# Patient Record
Sex: Female | Born: 1945 | Race: White | Hispanic: No | Marital: Married | Smoking: Former smoker
Health system: Southern US, Community
[De-identification: ages and names within clinical notes are randomized; demographics above are authoritative.]

## PROBLEM LIST (undated history)

## (undated) DIAGNOSIS — M069 Rheumatoid arthritis, unspecified: Secondary | ICD-10-CM

## (undated) DIAGNOSIS — E079 Disorder of thyroid, unspecified: Secondary | ICD-10-CM

## (undated) DIAGNOSIS — J45909 Unspecified asthma, uncomplicated: Secondary | ICD-10-CM

## (undated) DIAGNOSIS — I10 Essential (primary) hypertension: Secondary | ICD-10-CM

## (undated) DIAGNOSIS — M810 Age-related osteoporosis without current pathological fracture: Secondary | ICD-10-CM

## (undated) DIAGNOSIS — Z86010 Personal history of colonic polyps: Secondary | ICD-10-CM

## (undated) DIAGNOSIS — E785 Hyperlipidemia, unspecified: Secondary | ICD-10-CM

## (undated) DIAGNOSIS — H269 Unspecified cataract: Secondary | ICD-10-CM

## (undated) DIAGNOSIS — R011 Cardiac murmur, unspecified: Secondary | ICD-10-CM

## (undated) DIAGNOSIS — K5900 Constipation, unspecified: Secondary | ICD-10-CM

## (undated) HISTORY — DX: Hyperlipidemia, unspecified: E78.5

## (undated) HISTORY — PX: MOUTH SURGERY: SHX715

## (undated) HISTORY — DX: Rheumatoid arthritis, unspecified: M06.9

## (undated) HISTORY — DX: Constipation, unspecified: K59.00

## (undated) HISTORY — PX: POLYPECTOMY: SHX149

## (undated) HISTORY — DX: Cardiac murmur, unspecified: R01.1

## (undated) HISTORY — DX: Personal history of colonic polyps: Z86.010

## (undated) HISTORY — DX: Age-related osteoporosis without current pathological fracture: M81.0

## (undated) HISTORY — DX: Unspecified asthma, uncomplicated: J45.909

## (undated) HISTORY — PX: COLONOSCOPY: SHX174

## (undated) HISTORY — DX: Essential (primary) hypertension: I10

## (undated) HISTORY — DX: Disorder of thyroid, unspecified: E07.9

## (undated) HISTORY — DX: Unspecified cataract: H26.9

---

## 2004-09-12 ENCOUNTER — Ambulatory Visit: Payer: Self-pay | Admitting: Gastroenterology

## 2004-10-11 ENCOUNTER — Ambulatory Visit: Payer: Self-pay | Admitting: Gastroenterology

## 2012-10-03 ENCOUNTER — Encounter: Payer: Self-pay | Admitting: Gastroenterology

## 2014-08-06 DIAGNOSIS — D126 Benign neoplasm of colon, unspecified: Secondary | ICD-10-CM

## 2014-08-06 HISTORY — DX: Benign neoplasm of colon, unspecified: D12.6

## 2014-10-06 ENCOUNTER — Encounter: Payer: Self-pay | Admitting: Gastroenterology

## 2015-03-01 ENCOUNTER — Encounter: Payer: Self-pay | Admitting: Gastroenterology

## 2015-05-03 ENCOUNTER — Ambulatory Visit (AMBULATORY_SURGERY_CENTER): Payer: Self-pay

## 2015-05-03 VITALS — Ht <= 58 in | Wt 145.0 lb

## 2015-05-03 DIAGNOSIS — Z1211 Encounter for screening for malignant neoplasm of colon: Secondary | ICD-10-CM

## 2015-05-03 MED ORDER — NA SULFATE-K SULFATE-MG SULF 17.5-3.13-1.6 GM/177ML PO SOLN: ORAL | Status: DC

## 2015-05-03 NOTE — Progress Notes (Signed)
Per pt, no allergies to soy or egg products.Pt not taking any weight loss meds or using  O2 at home. 

## 2015-05-04 ENCOUNTER — Encounter: Payer: Self-pay | Admitting: Gastroenterology

## 2015-05-17 ENCOUNTER — Encounter: Payer: Self-pay | Admitting: Gastroenterology

## 2015-05-17 ENCOUNTER — Ambulatory Visit (AMBULATORY_SURGERY_CENTER): Payer: Medicare Other | Admitting: Gastroenterology

## 2015-05-17 VITALS — BP 116/78 | HR 69 | Temp 97.9°F | Resp 12 | Ht <= 58 in | Wt 145.0 lb

## 2015-05-17 DIAGNOSIS — K635 Polyp of colon: Secondary | ICD-10-CM | POA: Diagnosis not present

## 2015-05-17 DIAGNOSIS — D120 Benign neoplasm of cecum: Secondary | ICD-10-CM

## 2015-05-17 DIAGNOSIS — Z1211 Encounter for screening for malignant neoplasm of colon: Secondary | ICD-10-CM | POA: Diagnosis not present

## 2015-05-17 DIAGNOSIS — D124 Benign neoplasm of descending colon: Secondary | ICD-10-CM

## 2015-05-17 MED ORDER — SODIUM CHLORIDE 0.9 % IV SOLN: 500.0000 mL | INTRAVENOUS | Status: DC

## 2015-05-17 NOTE — Progress Notes (Signed)
Called to room to assist during endoscopic procedure.  Patient ID and intended procedure confirmed with present staff. Received instructions for my participation in the procedure from the performing physician.  

## 2015-05-17 NOTE — Progress Notes (Signed)
Report to PACU, RN, vss, BBS= Clear.  

## 2015-05-17 NOTE — Op Note (Signed)
[place]  [address] [place], [id number]   COLONOSCOPY PROCEDURE REPORT PATIENT: Haley Pham, Haley Pham  MR#: [id number] BIRTHDATE: 08/26/1945 , 38  yrs. old GENDER: female ENDOSCOPIST: Ladene Artist, MD, [facility] REFERRED JJ:OACZ Bea Graff, M.D. PROCEDURE DATE:  05/17/2015 PROCEDURE:   Colonoscopy, screening and Colonoscopy with snare polypectomy First Screening Colonoscopy - Avg.  risk and is 50 yrs.  old or older - No.  Prior Negative Screening - Now for repeat screening. 10 or more years since last screening  History of Adenoma - Now for follow-up colonoscopy & has been > or = to 3 yrs.  N/A  Polyps removed today? Yes ASA CLASS:   Class II INDICATIONS:Screening for colonic neoplasia and Colorectal Neoplasm Risk Assessment for this procedure is average risk. MEDICATIONS: Monitored anesthesia care, Propofol 300 mg IV, and lidocaine 130 mg IV DESCRIPTION OF PROCEDURE:   After the risks benefits and alternatives of the procedure were thoroughly explained, informed consent was obtained.  The digital rectal exam revealed no abnormalities of the rectum.   The LB [id number]-H190 [id number]  endoscope was introduced through the anus and advanced to the cecum, which was identified by both the appendix and ileocecal valve. No adverse events experienced.   Limited by a tortuous and redundant colon. The quality of the prep was good.  (Suprep was used)  The instrument was then slowly withdrawn as the colon was fully examined. Estimated blood loss is zero unless otherwise noted in this procedure report.  COLON FINDINGS: A sessile polyp measuring 12 mm in size was found at the cecum.  A polypectomy was performed using snare cautery.  The resection was complete, the polyp tissue was completely retrieved and sent to histology. A pedunculated polyp measuring 7 mm in size was found in the descending colon.  A polypectomy was performed with a cold snare.  The resection was complete, the polyp  tissue was completely retrieved and sent to histology. There was moderate diverticulosis noted in the sigmoid colon.   The examination was otherwise normal.  Retroflexed views revealed internal Grade I hemorrhoids. The time to cecum = 10.9 Withdrawal time = 14.3   The scope was withdrawn and the procedure completed. COMPLICATIONS: There were no immediate complications. ENDOSCOPIC IMPRESSION: 1.   Sessile polyp at the cecum; polypectomy performed using snare cautery 2.   Pedunculated polyp in the descending colon; polypectomy performed with a cold snare 3.   Moderate diverticulosis in the sigmoid colon 4.   Grade l internal hemorrhoids  RECOMMENDATIONS: 1.  Hold Aspirin and all other NSAIDS for 2 weeks. 2.  Await pathology results 3.  Repeat colonoscopy 3 years if larger polyp adenomatous; 5 years if only smaller polyp adenomatous; otherwise 10 years 4.  High fiber diet with liberal fluid intake.  eSigned:  Ladene Artist, MD, [facility] 05/17/2015 9:47 AM

## 2015-05-17 NOTE — Patient Instructions (Signed)
YOU HAD AN ENDOSCOPIC PROCEDURE TODAY AT [facility]:   Refer to the procedure report that was given to you for any specific questions about what was found during the examination.  If the procedure report does not answer your questions, please call your gastroenterologist to clarify.  If you requested that your care partner not be given the details of your procedure findings, then the procedure report has been included in a sealed envelope for you to review at your convenience later.  YOU SHOULD EXPECT: Some feelings of bloating in the abdomen. Passage of more gas than usual.  Walking can help get rid of the air that was put into your GI tract during the procedure and reduce the bloating. If you had a lower endoscopy (such as a colonoscopy or flexible sigmoidoscopy) you may notice spotting of blood in your stool or on the toilet paper. If you underwent a bowel prep for your procedure, you may not have a normal bowel movement for a few days.  Please Note:  You might notice some irritation and congestion in your nose or some drainage.  This is from the oxygen used during your procedure.  There is no need for concern and it should clear up in a day or so.  SYMPTOMS TO REPORT IMMEDIATELY:   Following lower endoscopy (colonoscopy or flexible sigmoidoscopy):  Excessive amounts of blood in the stool  Significant tenderness or worsening of abdominal pains  Swelling of the abdomen that is new, acute  Fever of 100F or higher  For urgent or emergent issues, a gastroenterologist can be reached at any hour by calling [phone].   DIET: Your first meal following the procedure should be a small meal and then it is ok to progress to your normal diet. Heavy or fried foods are harder to digest and may make you feel nauseous or bloated.  Likewise, meals heavy in dairy and vegetables can increase bloating.  Drink plenty of fluids but you should avoid alcoholic beverages for 24  hours.  ACTIVITY:  You should plan to take it easy for the rest of today and you should NOT DRIVE or use heavy machinery until tomorrow (because of the sedation medicines used during the test).    FOLLOW UP: Our staff will call the number listed on your records the next business day following your procedure to check on you and address any questions or concerns that you may have regarding the information given to you following your procedure. If we do not reach you, we will leave a message.  However, if you are feeling well and you are not experiencing any problems, there is no need to return our call.  We will assume that you have returned to your regular daily activities without incident.  If any biopsies were taken you will be contacted by phone or by letter within the next 1-3 weeks.  Please call us at [phone] if you have not heard about the biopsies in 3 weeks.    SIGNATURES/CONFIDENTIALITY: You and/or your care partner have signed paperwork which will be entered into your electronic medical record.  These signatures attest to the fact that that the information above on your After Visit Summary has been reviewed and is understood.  Full responsibility of the confidentiality of this discharge information lies with you and/or your care-partner.  Hold Aspirin and all other NSAIDS for 2 weeks Await pathology results

## 2015-05-18 ENCOUNTER — Telehealth: Payer: Self-pay

## 2015-05-18 NOTE — Telephone Encounter (Signed)
  Follow up Call-  Call back number 05/17/2015  Post procedure Call Back phone  # [phone]  Permission to leave phone message Yes     Patient questions:  Do you have a fever, pain , or abdominal swelling? No. Pain Score  0 *  Have you tolerated food without any problems? Yes.    Have you been able to return to your normal activities? Yes.    Do you have any questions about your discharge instructions: Diet   No. Medications  No. Follow up visit  No.  Do you have questions or concerns about your Care? No.  Actions: * If pain score is 4 or above: No action needed, pain <4.  No problems per the pt.  She had questions about the photos on the procedure report that I answered. maw

## 2015-05-23 ENCOUNTER — Encounter: Payer: Self-pay | Admitting: Gastroenterology

## 2015-05-27 ENCOUNTER — Telehealth: Payer: Self-pay | Admitting: Gastroenterology

## 2015-05-27 NOTE — Telephone Encounter (Signed)
All questions answered about results of pathology.  She will call back for any additional questions or concerns

## 2015-05-27 NOTE — Telephone Encounter (Signed)
Left message for patient to call back  

## 2015-08-07 HISTORY — PX: CATARACT EXTRACTION: SUR2

## 2015-10-03 DIAGNOSIS — E039 Hypothyroidism, unspecified: Secondary | ICD-10-CM

## 2015-10-03 DIAGNOSIS — J4520 Mild intermittent asthma, uncomplicated: Secondary | ICD-10-CM

## 2015-10-03 DIAGNOSIS — F419 Anxiety disorder, unspecified: Secondary | ICD-10-CM | POA: Insufficient documentation

## 2015-10-03 DIAGNOSIS — M1990 Unspecified osteoarthritis, unspecified site: Secondary | ICD-10-CM

## 2015-10-03 DIAGNOSIS — Z79899 Other long term (current) drug therapy: Secondary | ICD-10-CM | POA: Insufficient documentation

## 2015-10-03 DIAGNOSIS — E782 Mixed hyperlipidemia: Secondary | ICD-10-CM | POA: Insufficient documentation

## 2015-10-03 DIAGNOSIS — M069 Rheumatoid arthritis, unspecified: Secondary | ICD-10-CM | POA: Insufficient documentation

## 2015-10-03 DIAGNOSIS — E559 Vitamin D deficiency, unspecified: Secondary | ICD-10-CM | POA: Insufficient documentation

## 2015-10-03 DIAGNOSIS — I10 Essential (primary) hypertension: Secondary | ICD-10-CM | POA: Insufficient documentation

## 2015-10-03 HISTORY — DX: Anxiety disorder, unspecified: F41.9

## 2015-10-03 HISTORY — DX: Essential (primary) hypertension: I10

## 2015-10-03 HISTORY — DX: Other long term (current) drug therapy: Z79.899

## 2015-10-03 HISTORY — DX: Unspecified osteoarthritis, unspecified site: M19.90

## 2015-10-03 HISTORY — DX: Mixed hyperlipidemia: E78.2

## 2015-10-03 HISTORY — DX: Vitamin D deficiency, unspecified: E55.9

## 2015-10-03 HISTORY — DX: Hypothyroidism, unspecified: E03.9

## 2015-10-03 HISTORY — DX: Mild intermittent asthma, uncomplicated: J45.20

## 2017-03-27 DIAGNOSIS — H833X1 Noise effects on right inner ear: Secondary | ICD-10-CM

## 2017-03-27 HISTORY — DX: Noise effects on right inner ear: H83.3X1

## 2017-09-12 DIAGNOSIS — M8589 Other specified disorders of bone density and structure, multiple sites: Secondary | ICD-10-CM

## 2017-09-12 DIAGNOSIS — H3530 Unspecified macular degeneration: Secondary | ICD-10-CM

## 2017-09-12 DIAGNOSIS — R7303 Prediabetes: Secondary | ICD-10-CM

## 2017-09-12 HISTORY — DX: Other specified disorders of bone density and structure, multiple sites: M85.89

## 2017-09-12 HISTORY — DX: Prediabetes: R73.03

## 2017-09-12 HISTORY — DX: Unspecified macular degeneration: H35.30

## 2018-03-12 DIAGNOSIS — F5101 Primary insomnia: Secondary | ICD-10-CM

## 2018-03-12 HISTORY — DX: Primary insomnia: F51.01

## 2018-06-02 ENCOUNTER — Encounter: Payer: Self-pay | Admitting: Gastroenterology

## 2018-06-04 ENCOUNTER — Encounter: Payer: Self-pay | Admitting: Gastroenterology

## 2018-07-08 ENCOUNTER — Other Ambulatory Visit: Payer: Self-pay

## 2018-07-08 ENCOUNTER — Ambulatory Visit (AMBULATORY_SURGERY_CENTER): Payer: Self-pay

## 2018-07-08 VITALS — Ht <= 58 in | Wt 153.4 lb

## 2018-07-08 DIAGNOSIS — Z86010 Personal history of colonic polyps: Secondary | ICD-10-CM

## 2018-07-08 MED ORDER — PEG 3350-KCL-NA BICARB-NACL 420 G PO SOLR: 4000.0000 mL | Freq: Once | ORAL | 0 refills | Status: AC

## 2018-07-08 NOTE — Progress Notes (Signed)
No egg or soy allergy known to patient  No issues with past sedation with any surgeries  or procedures, no intubation problems  No diet pills per patient No home 02 use per patient  No blood thinners per patient  Pt denies issues with constipation  No A fib or A flutter  EMMI video sent to pt's e mail  

## 2018-07-23 ENCOUNTER — Telehealth: Payer: Self-pay | Admitting: Gastroenterology

## 2018-07-23 DIAGNOSIS — R011 Cardiac murmur, unspecified: Secondary | ICD-10-CM | POA: Insufficient documentation

## 2018-07-23 NOTE — Telephone Encounter (Signed)
Patient will call back after her appt with primary to cancel if they advise.  Otherwise she will keep appt in the am

## 2018-07-23 NOTE — Telephone Encounter (Signed)
Pt's husband called back and said her PCP is recommending she cancel her colon tomorrow.  She will call back to reschedule

## 2018-07-23 NOTE — Telephone Encounter (Signed)
Pt called in today stating that she was not feeling well and think it may be related to bp however she has appt with pcp at 3pm and want to know if she should still start her prep?? She is unsure of what is going on with her and dont know if the dx will cause her to have to resched. She would like to speak with nurse

## 2018-07-24 ENCOUNTER — Encounter: Payer: Medicare Other | Admitting: Gastroenterology

## 2018-07-24 NOTE — Telephone Encounter (Signed)
Noted  

## 2018-08-27 ENCOUNTER — Other Ambulatory Visit (HOSPITAL_COMMUNITY): Payer: Self-pay | Admitting: Internal Medicine

## 2018-08-27 ENCOUNTER — Other Ambulatory Visit (INDEPENDENT_AMBULATORY_CARE_PROVIDER_SITE_OTHER): Payer: Medicare Other

## 2018-08-27 ENCOUNTER — Other Ambulatory Visit: Payer: Self-pay

## 2018-08-27 DIAGNOSIS — R011 Cardiac murmur, unspecified: Secondary | ICD-10-CM | POA: Diagnosis not present

## 2018-08-27 NOTE — Progress Notes (Signed)
Complete echocardiogram has been performed.  Jimmy Jewelene Mairena RDCS, RVT 

## 2018-09-09 ENCOUNTER — Encounter: Payer: Self-pay | Admitting: Gastroenterology

## 2018-10-07 ENCOUNTER — Other Ambulatory Visit: Payer: Self-pay

## 2018-10-07 ENCOUNTER — Ambulatory Visit (AMBULATORY_SURGERY_CENTER): Payer: Self-pay | Admitting: *Deleted

## 2018-10-07 VITALS — Ht <= 58 in | Wt 155.0 lb

## 2018-10-07 DIAGNOSIS — Z86010 Personal history of colonic polyps: Secondary | ICD-10-CM

## 2018-10-07 NOTE — Progress Notes (Signed)
No egg or soy allergy known to patient  No issues with past sedation with any surgeries  or procedures, no intubation problems  No diet pills per patient No home 02 use per patient  No blood thinners per patient  Pt denies issues with constipation  No A fib or A flutter  EMMI video sent to pt's e mail - declined  Pt has a golytely prep at home from 07-2018 PV

## 2018-10-20 ENCOUNTER — Telehealth: Payer: Self-pay | Admitting: *Deleted

## 2018-10-20 NOTE — Telephone Encounter (Signed)
Covid-19 travel screening questions  Have you traveled in the last 14 days? no If yes where?  Do you now or have you had a fever in the last 14 days? no  Do you have any respiratory symptoms of shortness of breath or cough now or in the last 14 days? no  Do you have any family members or close contacts with diagnosed or suspected Covid-19? no       

## 2018-10-21 ENCOUNTER — Encounter: Payer: Self-pay | Admitting: Gastroenterology

## 2018-10-21 ENCOUNTER — Other Ambulatory Visit: Payer: Self-pay

## 2018-10-21 ENCOUNTER — Ambulatory Visit (AMBULATORY_SURGERY_CENTER): Payer: Medicare Other | Admitting: Gastroenterology

## 2018-10-21 VITALS — BP 107/74 | HR 76 | Temp 97.3°F | Resp 18 | Ht <= 58 in | Wt 155.0 lb

## 2018-10-21 DIAGNOSIS — Z86010 Personal history of colonic polyps: Secondary | ICD-10-CM

## 2018-10-21 MED ORDER — SODIUM CHLORIDE 0.9 % IV SOLN: 500.0000 mL | Freq: Once | INTRAVENOUS | Status: DC

## 2018-10-21 NOTE — Patient Instructions (Signed)
YOU HAD AN ENDOSCOPIC PROCEDURE TODAY AT [facility]:   Refer to the procedure report that was given to you for any specific questions about what was found during the examination.  If the procedure report does not answer your questions, please call your gastroenterologist to clarify.  If you requested that your care partner not be given the details of your procedure findings, then the procedure report has been included in a sealed envelope for you to review at your convenience later.  YOU SHOULD EXPECT: Some feelings of bloating in the abdomen. Passage of more gas than usual.  Walking can help get rid of the air that was put into your GI tract during the procedure and reduce the bloating. If you had a lower endoscopy (such as a colonoscopy or flexible sigmoidoscopy) you may notice spotting of blood in your stool or on the toilet paper. If you underwent a bowel prep for your procedure, you may not have a normal bowel movement for a few days.  Please Note:  You might notice some irritation and congestion in your nose or some drainage.  This is from the oxygen used during your procedure.  There is no need for concern and it should clear up in a day or so.  Read all handouts given to you by your recovery room nurse.  SYMPTOMS TO REPORT IMMEDIATELY:   Following lower endoscopy (colonoscopy or flexible sigmoidoscopy):  Excessive amounts of blood in the stool  Significant tenderness or worsening of abdominal pains  Swelling of the abdomen that is new, acute  Fever of 100F or higher   For urgent or emergent issues, a gastroenterologist can be reached at any hour by calling [phone].   DIET:  We do recommend a small meal at first, but then you may proceed to your regular diet.  Drink plenty of fluids but you should avoid alcoholic beverages for 24 hours.  ACTIVITY:  You should plan to take it easy for the rest of today and you should NOT DRIVE or use heavy machinery until tomorrow  (because of the sedation medicines used during the test).    FOLLOW UP: Our staff will call the number listed on your records the next business day following your procedure to check on you and address any questions or concerns that you may have regarding the information given to you following your procedure. If we do not reach you, we will leave a message.  However, if you are feeling well and you are not experiencing any problems, there is no need to return our call.  We will assume that you have returned to your regular daily activities without incident.  If any biopsies were taken you will be contacted by phone or by letter within the next 1-3 weeks.  Please call us at [phone] if you have not heard about the biopsies in 3 weeks.    SIGNATURES/CONFIDENTIALITY: You and/or your care partner have signed paperwork which will be entered into your electronic medical record.  These signatures attest to the fact that that the information above on your After Visit Summary has been reviewed and is understood.  Full responsibility of the confidentiality of this discharge information lies with you and/or your care-partner.

## 2018-10-21 NOTE — Progress Notes (Signed)
PT taken to PACU. Monitors in place. VSS. Report given to RN. 

## 2018-10-21 NOTE — Op Note (Signed)
[facility] Patient Name: Haley Pham Procedure Date: 10/21/2018 9:31 AM MRN: [id number] Endoscopist: Ladene Artist , MD Age: 73 Referring MD:  Date of Birth: Mar 26, 1946 Gender: Female Account #: [id number] Procedure:                Colonoscopy Indications:              Surveillance: Personal history of adenomatous                            polyps on last colonoscopy > 3 years ago, High risk                            colon cancer surveillance: Personal history of                            adenoma with villous component Medicines:                Monitored Anesthesia Care Procedure:                Pre-Anesthesia Assessment:                           - Prior to the procedure, a History and Physical                            was performed, and patient medications and                            allergies were reviewed. The patient's tolerance of                            previous anesthesia was also reviewed. The risks                            and benefits of the procedure and the sedation                            options and risks were discussed with the patient.                            All questions were answered, and informed consent                            was obtained. Prior Anticoagulants: The patient has                            taken no previous anticoagulant or antiplatelet                            agents. ASA Grade Assessment: III - A patient with                            severe systemic disease. After reviewing the risks  and benefits, the patient was deemed in                            satisfactory condition to undergo the procedure.                           After obtaining informed consent, the colonoscope                            was passed under direct vision. Throughout the                            procedure, the patient's blood pressure, pulse, and                            oxygen saturations were monitored  continuously. The                            Colonoscope was introduced through the anus and                            advanced to the the cecum, identified by                            appendiceal orifice and ileocecal valve. The                            ileocecal valve, appendiceal orifice, and rectum                            were photographed. The quality of the bowel                            preparation was good. The colonoscopy was performed                            without difficulty. The patient tolerated the                            procedure well. Scope In: 9:36:36 AM Scope Out: 9:57:59 AM Scope Withdrawal Time: 0 hours 14 minutes 45 seconds  Total Procedure Duration: 0 hours 21 minutes 23 seconds  Findings:                 The perianal and digital rectal examinations were                            normal. Pertinent negatives include normal                            sphincter tone.                           Multiple medium-mouthed diverticula were found in  the left colon. There was no evidence of                            diverticular bleeding.                           Internal hemorrhoids were found during                            retroflexion. The hemorrhoids were small and Grade                            I (internal hemorrhoids that do not prolapse).                           The exam was otherwise without abnormality on                            direct and retroflexion views. Complications:            No immediate complications. Estimated blood loss:                            None. Estimated Blood Loss:     Estimated blood loss: none. Impression:               - Moderate diverticulosis in the left colon. There                            was no evidence of diverticular bleeding.                           - Internal hemorrhoids.                           - The examination was otherwise normal on direct                             and retroflexion views.                           - No specimens collected. Recommendation:           - Repeat colonoscopy in 5 years for surveillance.                           - Patient has a contact number available for                            emergencies. The signs and symptoms of potential                            delayed complications were discussed with the                            patient. Return to normal activities tomorrow.  Written discharge instructions were provided to the                            patient.                           - High fiber diet.                           - Continue present medications. Ladene Artist, MD 10/21/2018 10:07:12 AM This report has been signed electronically.

## 2018-10-22 ENCOUNTER — Telehealth: Payer: Self-pay

## 2018-10-22 NOTE — Telephone Encounter (Signed)
  Follow up Call-  Call back number 10/21/2018  Post procedure Call Back phone  # [phone]  Permission to leave phone message Yes  Some recent data might be hidden     Patient questions:  Do you have a fever, pain , or abdominal swelling? No. Pain Score  0 *  Have you tolerated food without any problems? Yes.    Have you been able to return to your normal activities? Yes.    Do you have any questions about your discharge instructions: Diet   No. Medications  No. Follow up visit  No.  Do you have questions or concerns about your Care? No.  Actions: * If pain score is 4 or above: No action needed, pain <4.

## 2019-01-08 ENCOUNTER — Telehealth: Payer: Self-pay | Admitting: Gastroenterology

## 2019-01-08 NOTE — Telephone Encounter (Signed)
Patient advised that this would be better evaluated as an in office visit.  She is placed on the cancellation list.

## 2019-01-08 NOTE — Telephone Encounter (Signed)
Pt reported fecal incontinence.  Please call her to advise.  Scheduled for virtual with Dr. Fuller Plan 6/24.

## 2019-01-27 ENCOUNTER — Telehealth: Payer: Self-pay

## 2019-01-27 NOTE — Telephone Encounter (Signed)
Covid-19 screening questions   Do you now or have you had a fever in the last 14 days? No  Do you have any respiratory symptoms of shortness of breath or cough now or in the last 14 days? No  Do you have any family members or close contacts with diagnosed or suspected Covid-19 in the past 14 days? No  Have you been tested for Covid-19 and found to be positive? No        

## 2019-01-27 NOTE — Telephone Encounter (Signed)
Error

## 2019-01-28 ENCOUNTER — Encounter: Payer: Self-pay | Admitting: Gastroenterology

## 2019-01-28 ENCOUNTER — Ambulatory Visit (INDEPENDENT_AMBULATORY_CARE_PROVIDER_SITE_OTHER): Payer: Medicare Other | Admitting: Gastroenterology

## 2019-01-28 VITALS — BP 124/70 | HR 62 | Ht <= 58 in | Wt 149.0 lb

## 2019-01-28 DIAGNOSIS — R152 Fecal urgency: Secondary | ICD-10-CM

## 2019-01-28 DIAGNOSIS — Z86010 Personal history of colonic polyps: Secondary | ICD-10-CM

## 2019-01-28 DIAGNOSIS — R159 Full incontinence of feces: Secondary | ICD-10-CM

## 2019-01-28 NOTE — Patient Instructions (Signed)
Take over the counter Colace three times a day. If this does not help your symptoms then start Miralax 1/2 scoop-2 scoops daily.   The goal is to have one complete bowel movement daily.   Thank you for choosing me and [facility].  Pricilla Riffle. Dagoberto Ligas., MD., [facility]

## 2019-01-28 NOTE — Progress Notes (Signed)
    History of Present Illness: This is a 73 year old female who relates constipation alternating with normal bowel movements.  She states when she has not had a bowel movement for 2 to 3 days she will often have urgency and occasionally incontinence.  She notes a large volume of soft stool.  She tried Colace which helped improve the frequency of her bowel movements but was not sufficient.  Anal sphincter tone prior to recent colonoscopy seemed normal.  Colonoscopy 10/2018 Left sided diverticulosis, small internal hemorrhoids.   Current Medications, Allergies, Past Medical History, Past Surgical History, Family History and Social History were reviewed in [employer] record.  Physical Exam: General: Well developed, well nourished, no acute distress Head: Normocephalic and atraumatic Eyes:  sclerae anicteric, EOMI Ears: Normal auditory acuity Mouth: No deformity or lesions Lungs: Clear throughout to auscultation Heart: Regular rate and rhythm; no murmurs, rubs or bruits Abdomen: Soft, non tender and non distended. No masses, hepatosplenomegaly or hernias noted. Normal Bowel sounds Rectal: Not done Musculoskeletal: Symmetrical with no gross deformities  Pulses:  Normal pulses noted Extremities: No clubbing, cyanosis, edema or deformities noted Neurological: Alert oriented x 4, grossly nonfocal Psychological:  Alert and cooperative. Normal mood and affect   Assessment and Recommendations:   1.  Constipation alternating with normal bowel movements.  Fecal urgency with incontinence generally associated with constipation constipation.  Advised to increase Colace to 2 or 3/day.  Increase daily fiber and water intake.  If Colace is not effective then discontinue and begin MiraLAX one half scoop to 2 scoops day daily titrated for a complete bowel movement daily.  Contact us in 1 month if symptoms not under adequate control.  2.  Personal history of tubulovillous adenoma in  2016.  Next surveillance colonoscopy recommended in March 2025.

## 2019-04-21 ENCOUNTER — Other Ambulatory Visit: Payer: Self-pay | Admitting: Physician Assistant

## 2019-10-19 DIAGNOSIS — D849 Immunodeficiency, unspecified: Secondary | ICD-10-CM | POA: Insufficient documentation

## 2019-10-19 HISTORY — DX: Immunodeficiency, unspecified: D84.9

## 2020-05-16 DIAGNOSIS — E538 Deficiency of other specified B group vitamins: Secondary | ICD-10-CM

## 2020-05-16 DIAGNOSIS — E611 Iron deficiency: Secondary | ICD-10-CM | POA: Insufficient documentation

## 2020-05-16 HISTORY — DX: Iron deficiency: E61.1

## 2020-05-16 HISTORY — DX: Deficiency of other specified B group vitamins: E53.8

## 2020-12-26 DIAGNOSIS — K5730 Diverticulosis of large intestine without perforation or abscess without bleeding: Secondary | ICD-10-CM

## 2020-12-26 HISTORY — DX: Diverticulosis of large intestine without perforation or abscess without bleeding: K57.30

## 2020-12-28 ENCOUNTER — Other Ambulatory Visit: Payer: Self-pay | Admitting: Specialist

## 2020-12-28 DIAGNOSIS — N83209 Unspecified ovarian cyst, unspecified side: Secondary | ICD-10-CM

## 2020-12-28 DIAGNOSIS — K8689 Other specified diseases of pancreas: Secondary | ICD-10-CM

## 2021-01-03 ENCOUNTER — Other Ambulatory Visit: Payer: Self-pay | Admitting: Specialist

## 2021-01-03 DIAGNOSIS — N838 Other noninflammatory disorders of ovary, fallopian tube and broad ligament: Secondary | ICD-10-CM

## 2021-01-16 ENCOUNTER — Ambulatory Visit
Admission: RE | Admit: 2021-01-16 | Discharge: 2021-01-16 | Disposition: A | Discharge status: Home / Self Care | Payer: Medicare Other | Source: Ambulatory Visit | Attending: Specialist | Admitting: Specialist

## 2021-01-16 ENCOUNTER — Other Ambulatory Visit: Payer: Self-pay

## 2021-01-16 DIAGNOSIS — K8689 Other specified diseases of pancreas: Secondary | ICD-10-CM

## 2021-01-16 DIAGNOSIS — N83209 Unspecified ovarian cyst, unspecified side: Secondary | ICD-10-CM

## 2021-01-16 IMAGING — MR MR ABDOMEN WO/W CM
16 of 20 series · 37 of 48 positions shown · IV contrast (12 mL Multihance)
Comparison: Abdominopelvic CT [DATE].

CLINICAL DATA: Low-density pancreatic lesions on abdominal CT.
Left-sided abdominal pain. No previous relevant surgery.

EXAM:
MRI ABDOMEN WITHOUT AND WITH CONTRAST
TECHNIQUE: Multiplanar multisequence MR imaging of the abdomen was performed
both before and after the administration of intravenous contrast.
CONTRAST:  12mL MULTIHANCE GADOBENATE DIMEGLUMINE 529 MG/ML IV SOLN

[Series 3: T2 · coronal · 5.0mm · 1.56mm/px · 1 of 33 slices shown (1 of 4)]
[im 1/33]
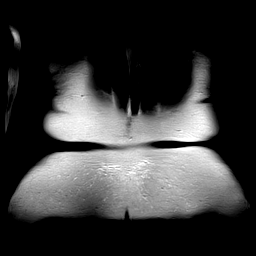

[Series 4: T1 · axial · 3.0mm · 1.19mm/px · z∈[+14,+203]mm · 4 of 128 slices shown]
[im 1/128]
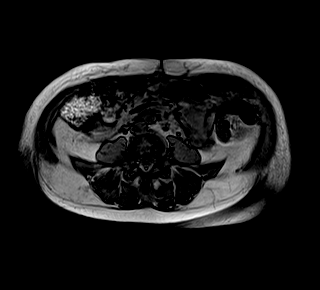
[im 43/128]
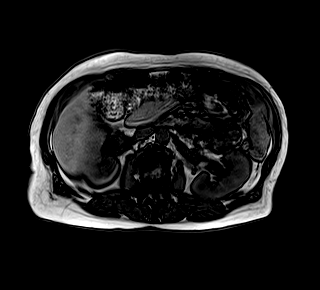
[im 85/128]
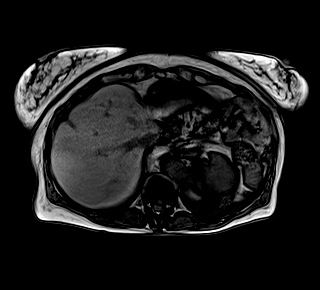
[im 128/128]
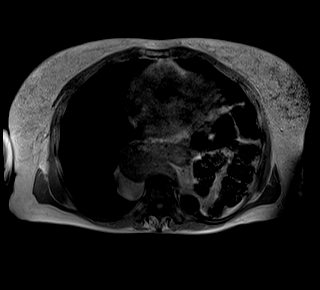

[Series 7: MRCP · coronal · 1.0mm · 0.49mm/px · 3 of 80 slices shown]
[im 1/80]
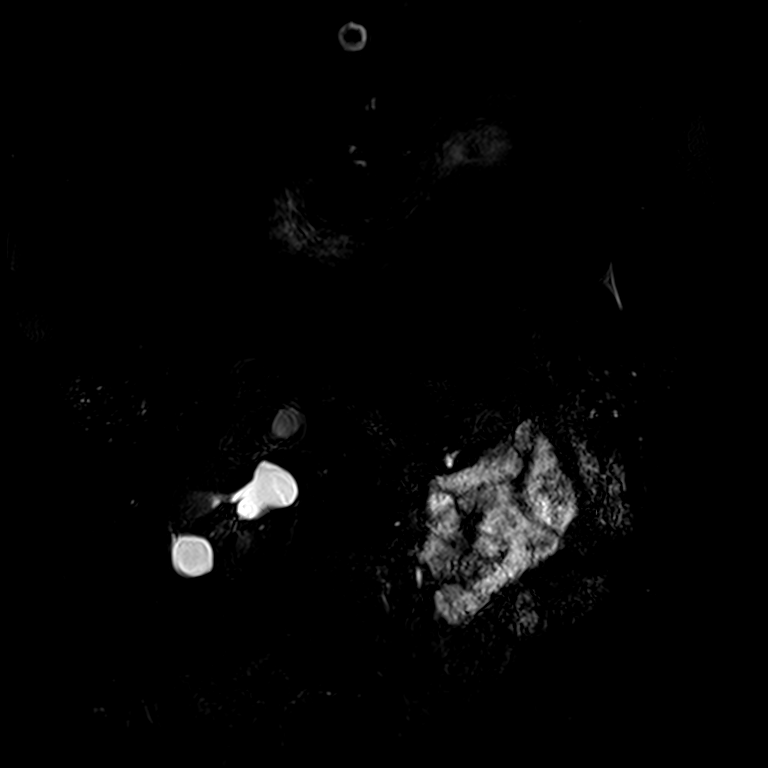
[im 40/80]
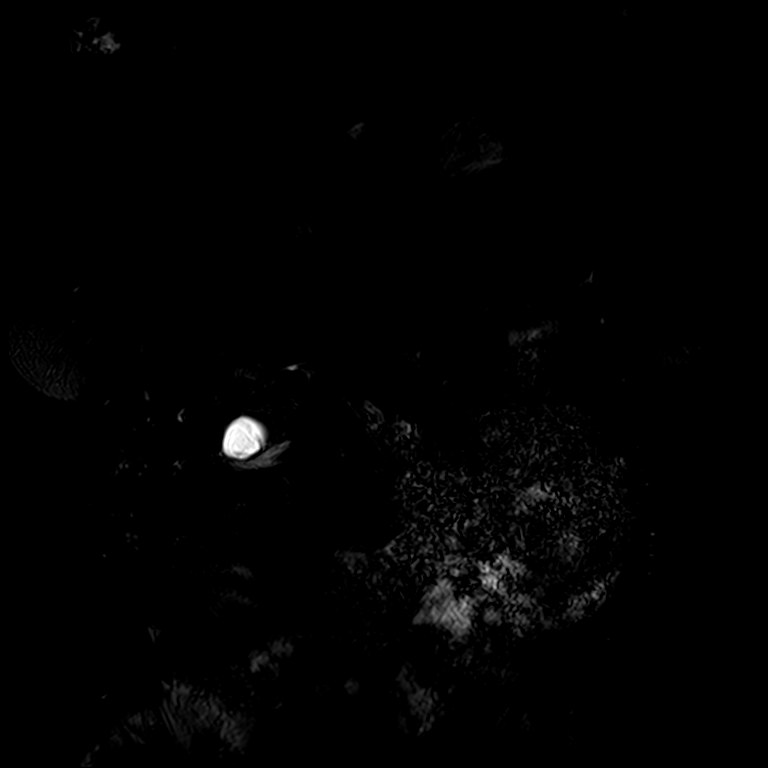
[im 80/80]
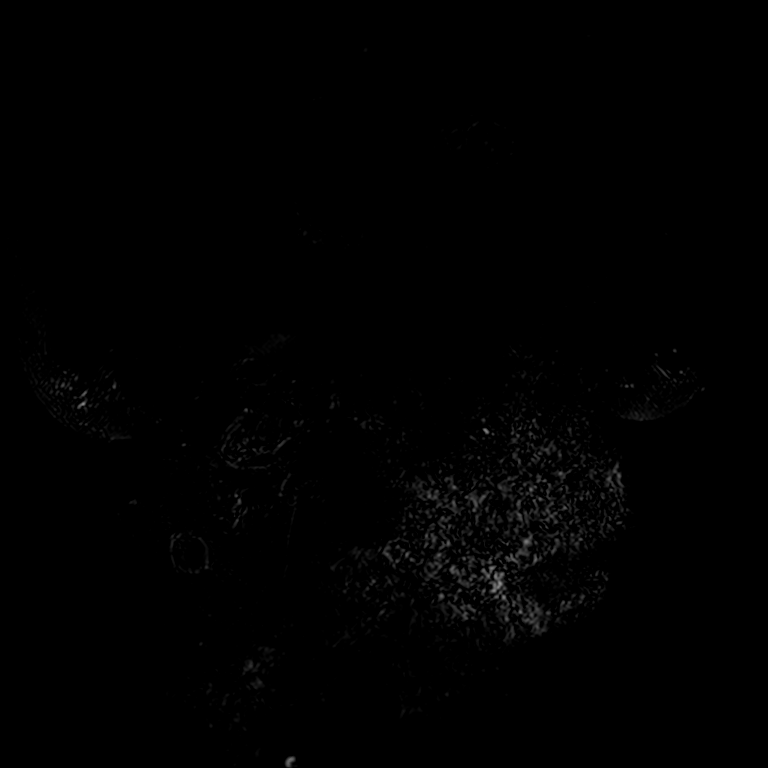

[Series 9: T2 · axial · 6.0mm · 1.22mm/px · 1 of 30 slices shown (2 of 4)]
[im 1/30]
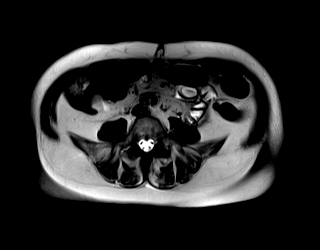

[Series 11: bSSFP · axial · 5.0mm · 1.25mm/px · 1 of 38 slices shown]
[im 1/38]
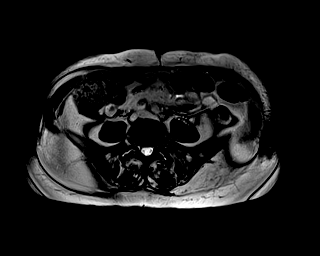

[Series 12: T2 · axial · 5.0mm · 1.48mm/px · 1 of 38 slices shown (3 of 4)]
[im 1/38]
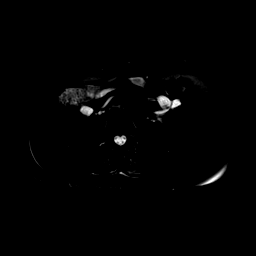

[Series 13: DWI · axial · 5.0mm · 1.42mm/px · z∈[+12,+222]mm · 4 of 108 slices shown (1 of 2)]
[im 1/108]
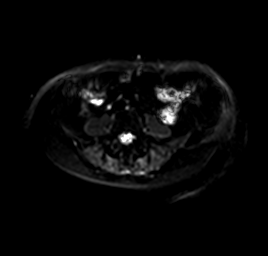
[im 36/108]
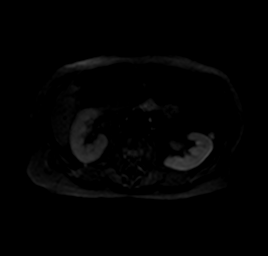
[im 72/108]
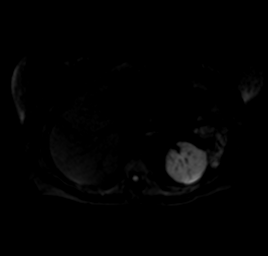
[im 108/108]
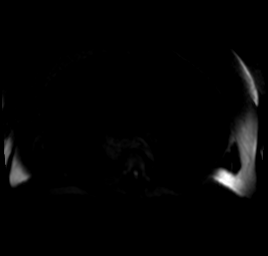

[Series 14: DWI · axial · 5.0mm · 1.42mm/px · 1 of 36 slices shown (2 of 2)]
[im 1/36]
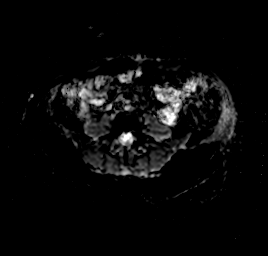

[Series 15: T2 · coronal · 3.0mm · 1.19mm/px · 1 of 25 slices shown (4 of 4)]
[im 1/25]
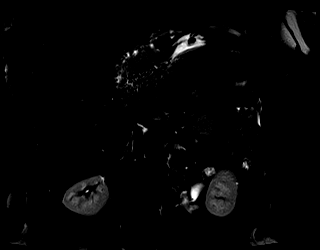

[Series 16: T1 dynamic · axial · non-contrast · 3.0mm · 1.25mm/px · z∈[+2,+215]mm · 3 of 72 slices shown]
[im 1/72]
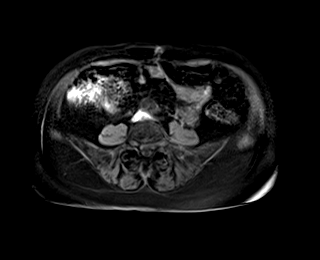
[im 36/72]
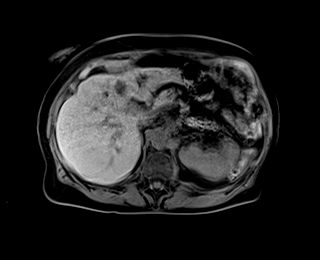
[im 72/72]
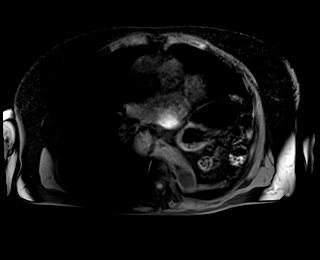

[Series 17: T1 dynamic post-contrast · axial · 3.0mm · 1.25mm/px · z∈[+2,+215]mm · 3 of 72 slices shown (1 of 6)]
[im 1/72]
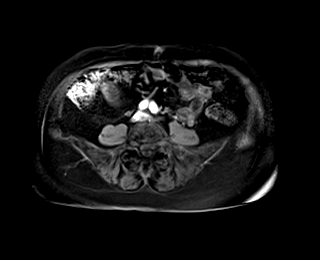
[im 36/72]
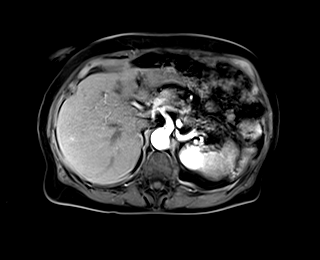
[im 72/72]
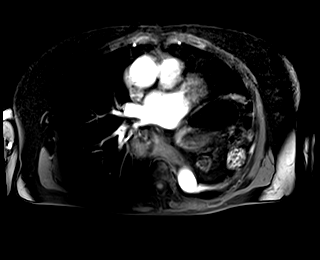

[Series 18: T1 dynamic post-contrast · axial · 3.0mm · 1.25mm/px · z∈[+2,+215]mm · 3 of 72 slices shown (2 of 6)]
[im 1/72]
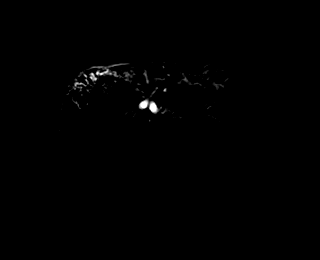
[im 36/72]
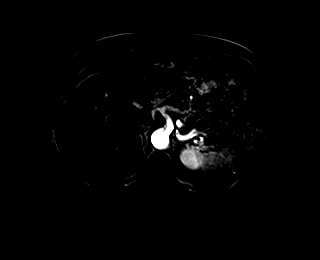
[im 72/72]
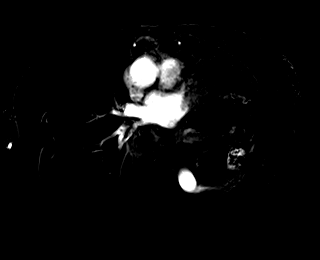

[Series 19: T1 dynamic post-contrast · axial · 3.0mm · 1.25mm/px · z∈[+2,+215]mm · 3 of 72 slices shown (3 of 6)]
[im 1/72]
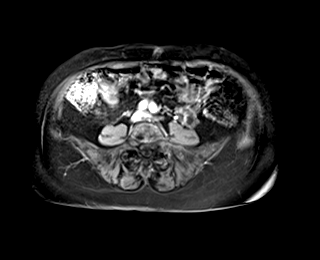
[im 36/72]
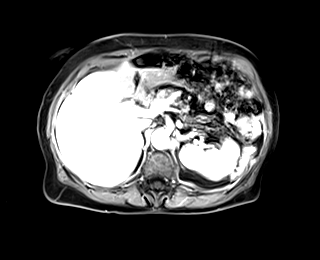
[im 72/72]
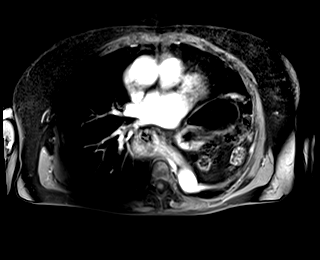

[Series 20: T1 dynamic post-contrast · axial · 3.0mm · 1.25mm/px · z∈[+2,+215]mm · 3 of 72 slices shown (4 of 6)]
[im 1/72]
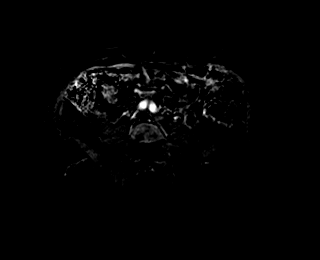
[im 36/72]
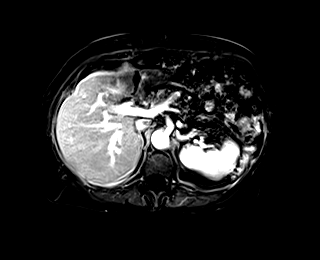
[im 72/72]
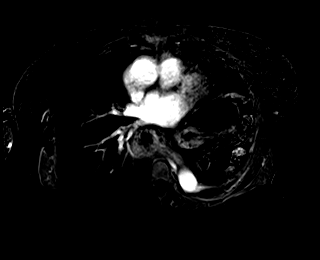

[Series 21: T1 dynamic post-contrast · axial · 3.0mm · 1.25mm/px · z∈[+2,+215]mm · 3 of 72 slices shown (5 of 6)]
[im 1/72]
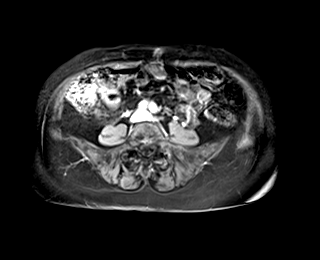
[im 36/72]
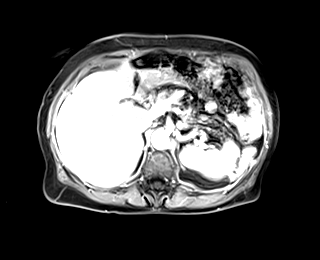
[im 72/72]
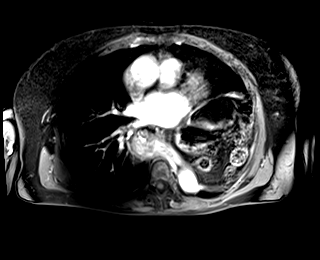

[Series 22: T1 dynamic post-contrast · axial · 3.0mm · 1.25mm/px · z∈[+2,+107]mm · 2 of 72 slices shown (6 of 6)]
[im 1/72]
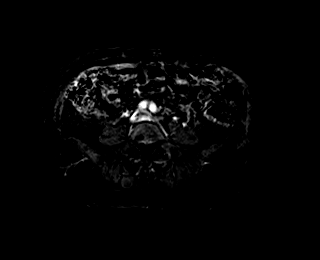
[im 36/72]
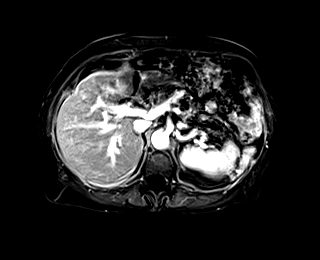

[37 of 48 positions shown; findings below may reference images not displayed]

FINDINGS: Lower chest: Large hiatal hernia again noted. The stomach is
completely herniated into the lower chest.

Hepatobiliary: No morphologic changes of cirrhosis or significant
steatosis. Dominant cyst in the left lobe measures 1.8 cm on image
[DATE]. There are additional tiny cysts or biliary hamartomas
scattered throughout the liver. No suspicious liver lesions. No
evidence of gallstones, gallbladder wall thickening or biliary
dilatation.

Pancreas: Multiple small cystic lesions are present throughout the
pancreas. The largest lesion in the proximal pancreatic tail
measures 1.6 x 1.3 cm on image [DATE]. There are other smaller
lesions, measuring up to 9 mm in the pancreatic tail on image [DATE].
These lesions show no abnormal enhancement following contrast. There
is no pancreatic ductal dilatation or surrounding inflammation.

Spleen: Normal in size without focal abnormality.

Adrenals/Urinary Tract: Both adrenal glands appear normal. Small
bilateral renal cysts. No hydronephrosis.

Stomach/Bowel: As above, large hiatal hernia. No evidence of bowel
obstruction or wall thickening.

Vascular/Lymphatic: There are no enlarged abdominal lymph nodes.
Aortic and branch vessel atherosclerosis without acute vascular
findings.

Other: 2.5 cm cystic lesion in the right adnexa is included on the
coronal images and demonstrates no suspicious enhancement. No
ascites.

Musculoskeletal: No acute or significant osseous findings.
Multilevel spondylosis.
IMPRESSION: 1. Multiple small pancreatic cystic lesions demonstrate no abnormal
enhancement or other aggressive characteristics. These are likely
small indolent cystic neoplasms or sequela of prior pancreatitis.
Per consensus guidelines, recommend follow-up by dedicated CT or MRI
in 2 years. This recommendation follows ACR consensus guidelines:
Management of Incidental Pancreatic Cysts: A White Paper of the ACR
Incidental Findings Committee. [HOSPITAL] [XU];[DATE].
2. Large hiatal hernia.
3. Simple hepatic and renal cysts.
4. Previously noted right adnexal cyst is incompletely evaluated by
this study, although demonstrates no aggressive characteristics.
5. No acute findings.

## 2021-01-16 MED ORDER — GADOBENATE DIMEGLUMINE 529 MG/ML IV SOLN
12.0000 mL | Freq: Once | INTRAVENOUS | Status: AC | PRN
  Administered 2021-01-16: 12 mL via INTRAVENOUS

## 2021-01-20 ENCOUNTER — Ambulatory Visit
Admission: RE | Admit: 2021-01-20 | Discharge: 2021-01-20 | Disposition: A | Discharge status: Home / Self Care | Payer: Medicare Other | Source: Ambulatory Visit | Attending: Specialist | Admitting: Specialist

## 2021-01-20 DIAGNOSIS — N838 Other noninflammatory disorders of ovary, fallopian tube and broad ligament: Secondary | ICD-10-CM

## 2021-01-20 IMAGING — US US PELVIS COMPLETE WITH TRANSVAGINAL
1 series · 13 of 25 positions shown · non-contrast
Comparison: MR abdomen [DATE]

CLINICAL DATA: Ovarian mass, abnormal MRI



[Series 1: us pelvis complete with transvaginal · 0.25mm/px · 13 of 56 slices shown]
[im 1/56]
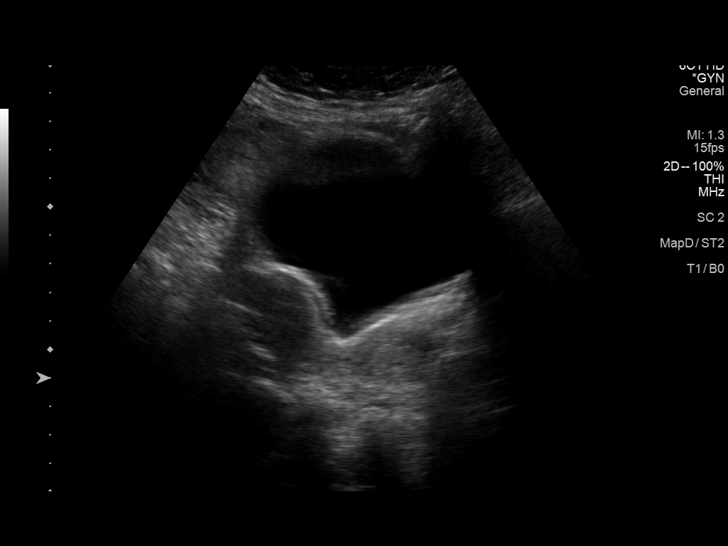
[im 5/56]
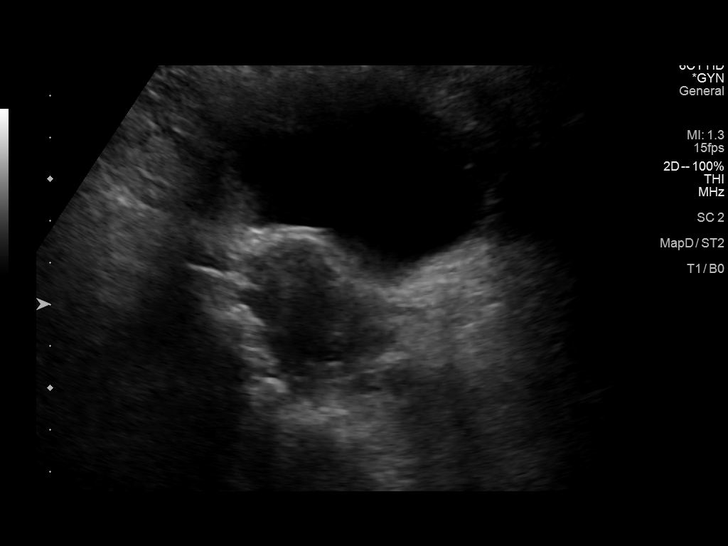
[im 10/56]
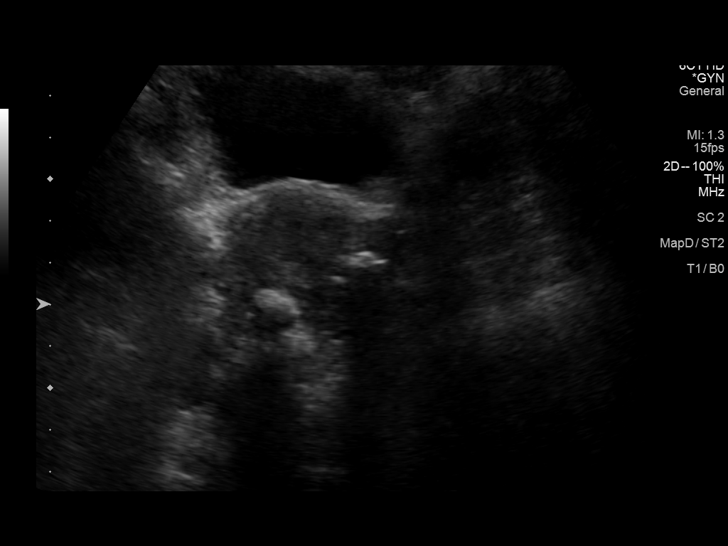
[im 14/56]
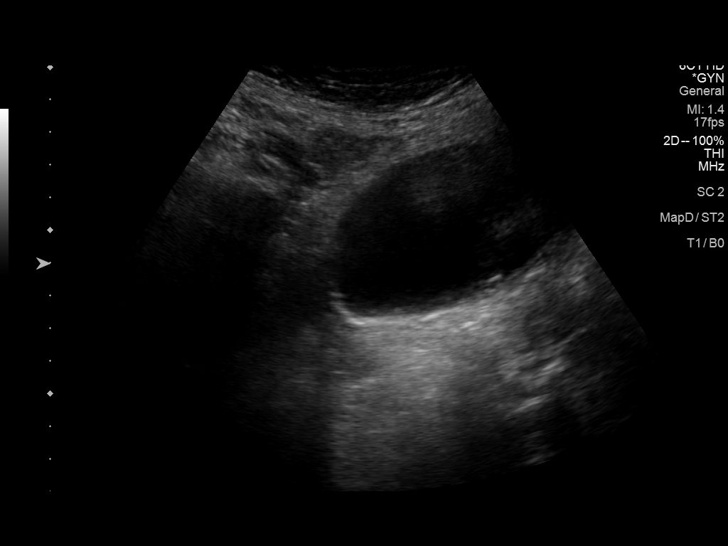
[im 19/56]
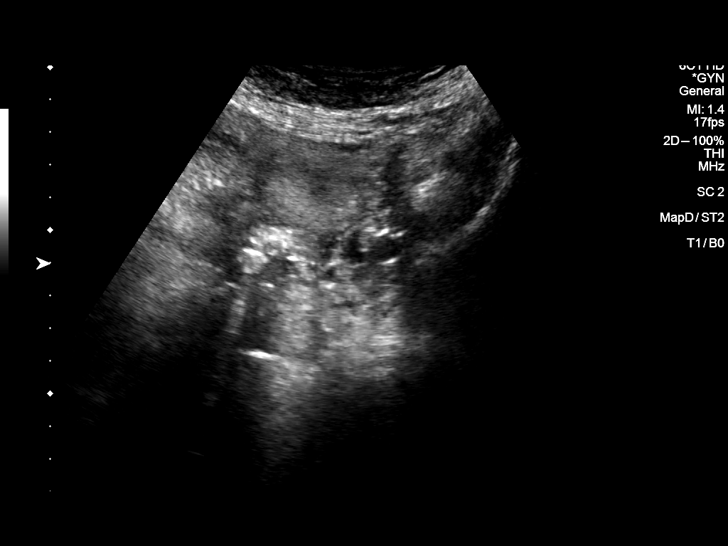
[im 23/56]
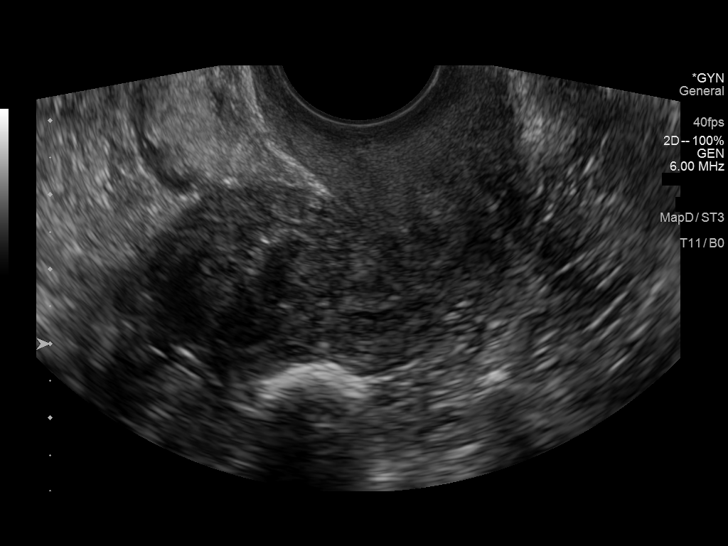
[im 28/56]
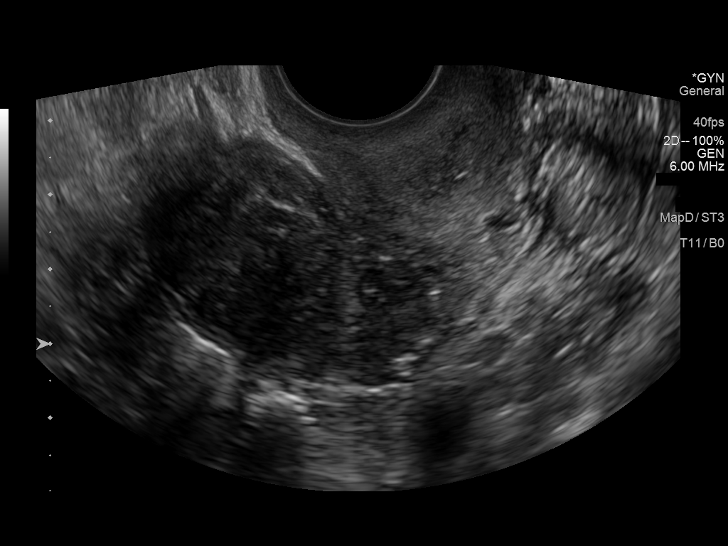
[im 33/56]
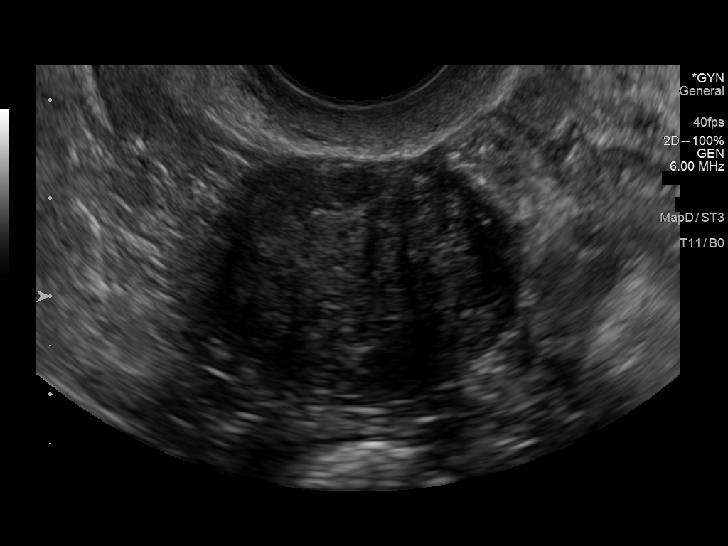
[im 37/56]
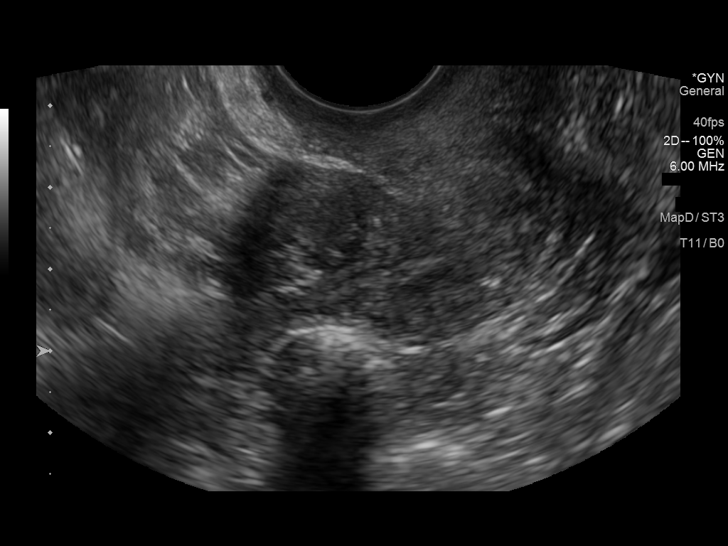
[im 42/56]
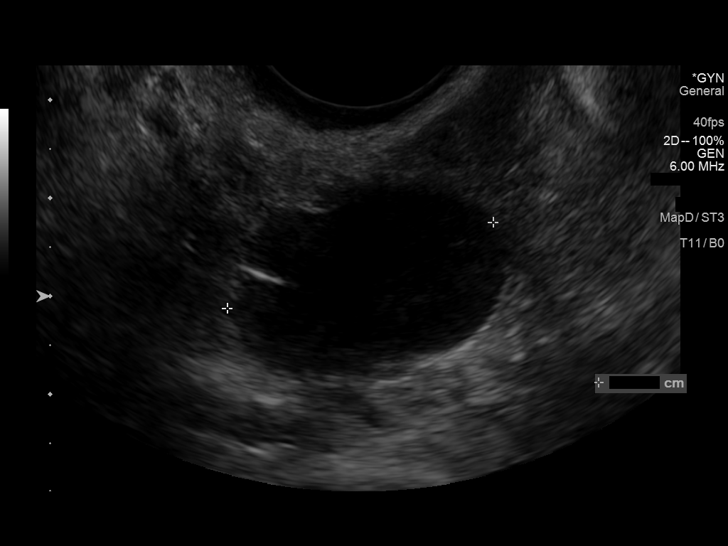
[im 46/56]
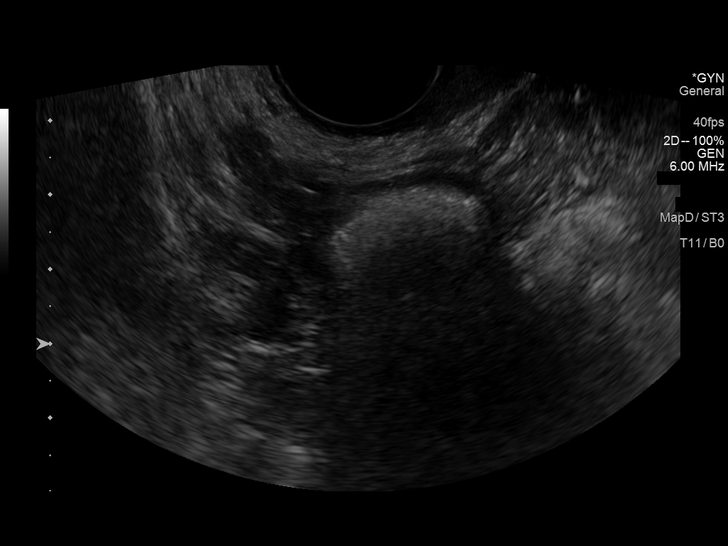
[im 51/56]
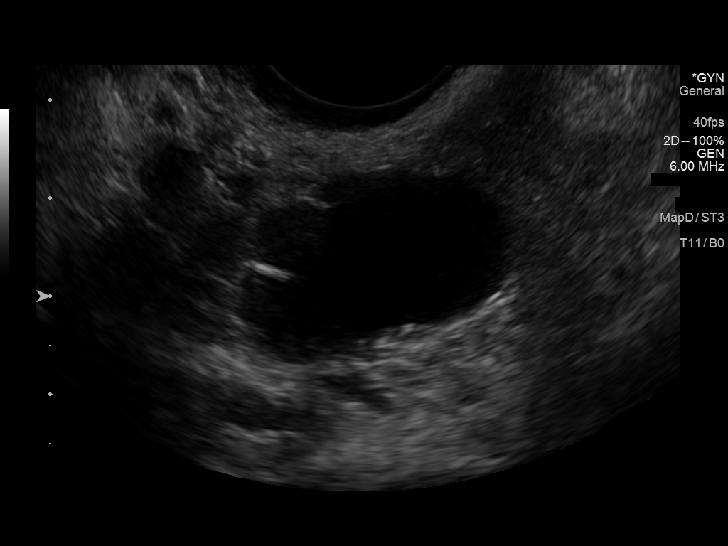
[im 56/56]
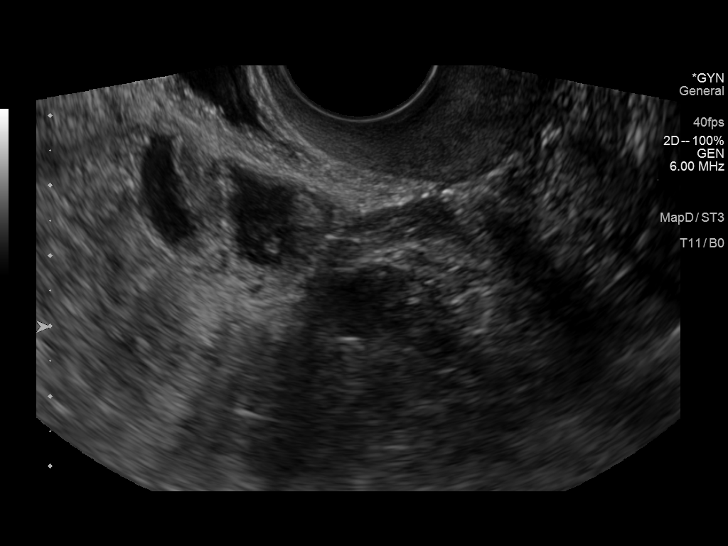

[13 of 25 positions shown; findings below may reference images not displayed]

FINDINGS: Uterus

Measurements: 7.3 x 3.0 x 5.0 cm = volume: 59 mL. Deviated to RIGHT.
Heterogeneous myometrium. Few scattered calcifications. Fundal
leiomyoma 2.8 x 2.4 x 2.7 cm, intramural. 11 mm calcified leiomyoma
at posterior upper uterus. No additional masses.

Endometrium

Thickness: 3 mm.  No focal mass.

Right ovary

No normal appearing RIGHT ovary visualized, see below

Left ovary

Not visualized, likely obscured by bowel

Other findings

Cystic mass identified within RIGHT adnexa 2.0 x 2.7 x 2.9 cm.
Lesion contains a partial septation, scattered internal
echogenicity, and slight irregularity along 1 wall. This lesion is
indeterminate but corresponds in size with the lesion identified on
MR. No free fluid.
IMPRESSION: Uterine leiomyomata.

Nonvisualization of LEFT ovary.

2.9 cm diameter complex cystic mass RIGHT adnexa, corresponding to
MR finding, lesion containing scattered internal echoes,
irregularity along 1 wall, and a partial septation.

Lesion does not represent a simple cyst and is is indeterminate;
either surgical evaluation or further characterization by MR imaging
with and without contrast recommended.

These results will be called to the ordering clinician or
representative by the Radiologist Assistant, and communication
documented in the PACS or [REDACTED].

## 2021-02-07 DIAGNOSIS — N838 Other noninflammatory disorders of ovary, fallopian tube and broad ligament: Secondary | ICD-10-CM | POA: Insufficient documentation

## 2021-02-07 HISTORY — DX: Other noninflammatory disorders of ovary, fallopian tube and broad ligament: N83.8

## 2021-02-10 ENCOUNTER — Other Ambulatory Visit: Payer: Self-pay | Admitting: Specialist

## 2021-02-10 DIAGNOSIS — N838 Other noninflammatory disorders of ovary, fallopian tube and broad ligament: Secondary | ICD-10-CM

## 2021-02-11 ENCOUNTER — Ambulatory Visit
Admission: RE | Admit: 2021-02-11 | Discharge: 2021-02-11 | Disposition: A | Discharge status: Home / Self Care | Payer: Medicare Other | Source: Ambulatory Visit | Attending: Specialist | Admitting: Specialist

## 2021-02-11 ENCOUNTER — Other Ambulatory Visit: Payer: Self-pay

## 2021-02-11 DIAGNOSIS — N838 Other noninflammatory disorders of ovary, fallopian tube and broad ligament: Secondary | ICD-10-CM

## 2021-02-11 IMAGING — MR MR PELVIS WO/W CM
12 of 18 series · 29 of 48 positions shown · IV contrast (12ml Multihance)
Comparison: CT of the abdomen and pelvis from [DATE] and MRI
of the abdomen from [DATE].

CLINICAL DATA: Cystic pelvic abnormality discovered on previous
abdominal MRI and CT of the abdomen and pelvis.

EXAM:
MRI PELVIS WITHOUT AND WITH CONTRAST
TECHNIQUE: Multiplanar multisequence MR imaging of the pelvis was performed
both before and after administration of intravenous contrast.
CONTRAST:  13mL MULTIHANCE GADOBENATE DIMEGLUMINE 529 MG/ML IV SOLN

[Series 4: T2 · coronal · 6.0mm · 1.56mm/px · 1 of 22 slices shown (1 of 4)]
[im 1/22]
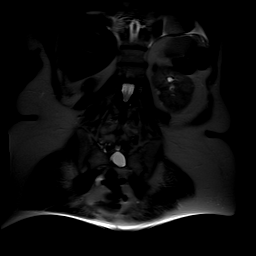

[Series 5: T2 · axial · 5.0mm · 0.94mm/px · 1 of 34 slices shown (2 of 4)]
[im 1/34]
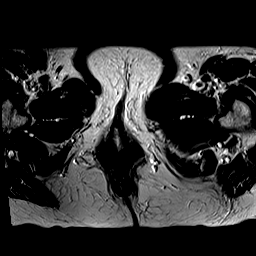

[Series 6: T2 fat-sat · axial · 5.0mm · 0.94mm/px · z∈[-197,+0]mm · 2 of 34 slices shown]
[im 1/34]
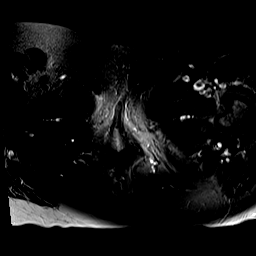
[im 34/34]
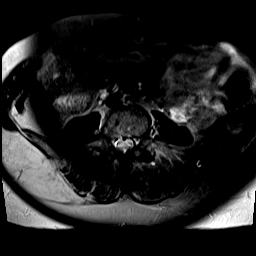

[Series 7: T2 · sagittal · 5.0mm · 0.94mm/px · 2 of 28 slices shown (3 of 4)]
[im 1/28]
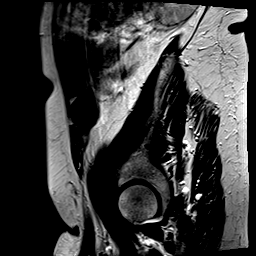
[im 28/28]
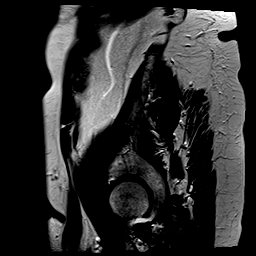

[Series 8: T2 · coronal · 3.0mm · 1.02mm/px · 2 of 38 slices shown (4 of 4)]
[im 1/38]
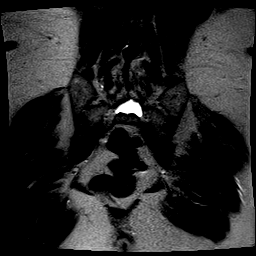
[im 38/38]
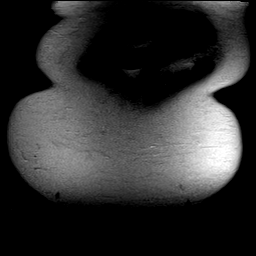

[Series 9: DWI · axial · 5.0mm · 1.72mm/px · z∈[-194,+4]mm · 5 of 92 slices shown]
[im 1/92]
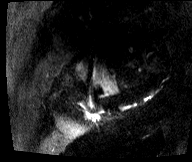
[im 23/92]
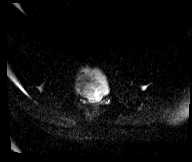
[im 46/92]
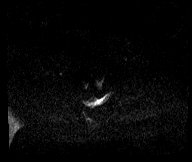
[im 69/92]
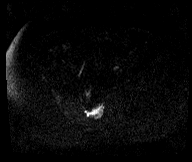
[im 92/92]
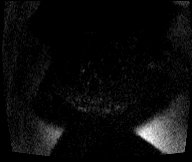

[Series 10: axial dwi_adc · axial · 5.0mm · 1.72mm/px · z∈[-194,+4]mm · 2 of 34 slices shown]
[im 1/34]
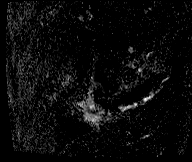
[im 34/34]
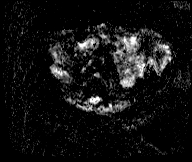

[Series 11: axial in out · axial · 5.5mm · 0.74mm/px · z∈[-197,+11]mm · 4 of 68 slices shown]
[im 1/68]
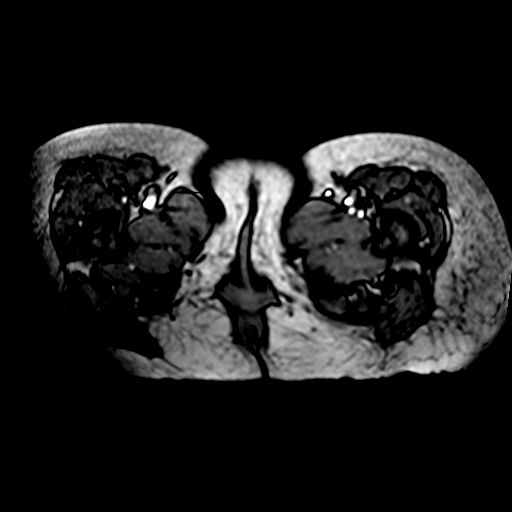
[im 23/68]
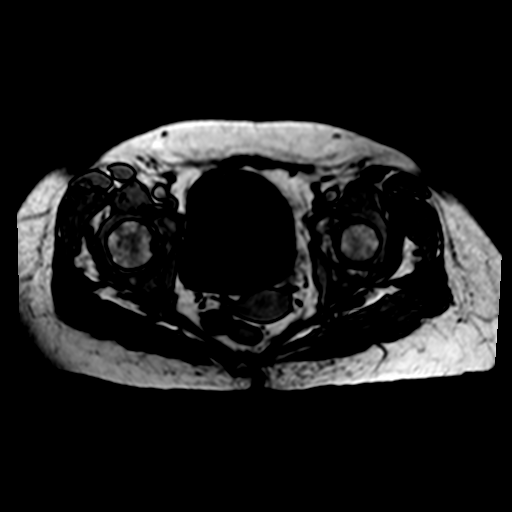
[im 45/68]
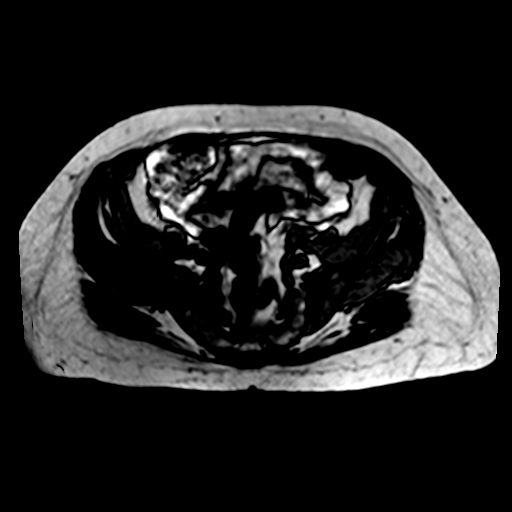
[im 68/68]
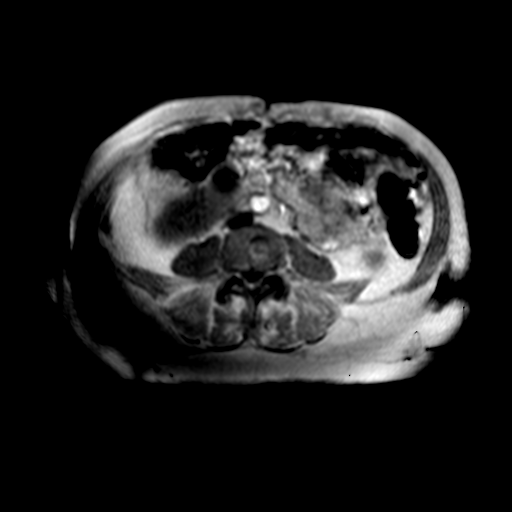

[Series 12: T1 dynamic · axial · non-contrast · 4.0mm · 0.49mm/px · z∈[-200,+3]mm · 3 of 52 slices shown (1 of 2)]
[im 1/52]
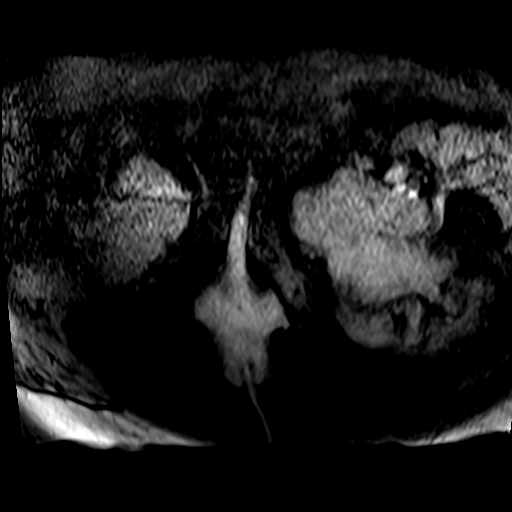
[im 26/52]
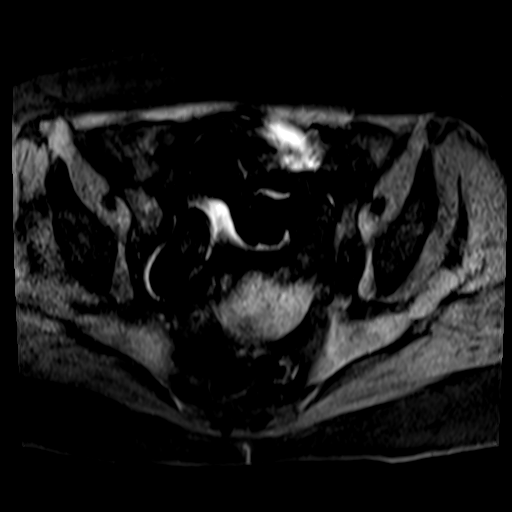
[im 52/52]
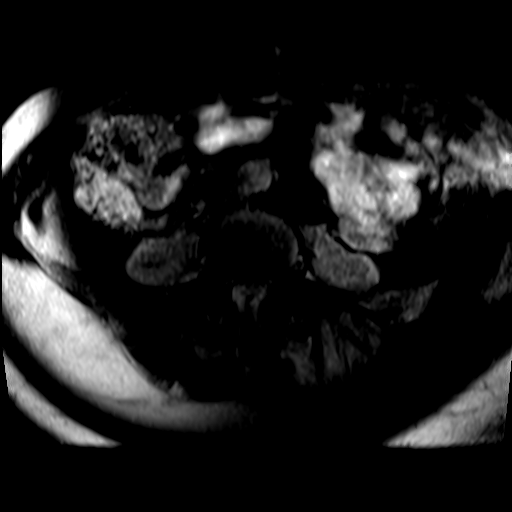

[Series 13: T1 dynamic post-contrast · axial · 4.0mm · 0.49mm/px · z∈[-200,+3]mm · 3 of 52 slices shown (1 of 2)]
[im 1/52]
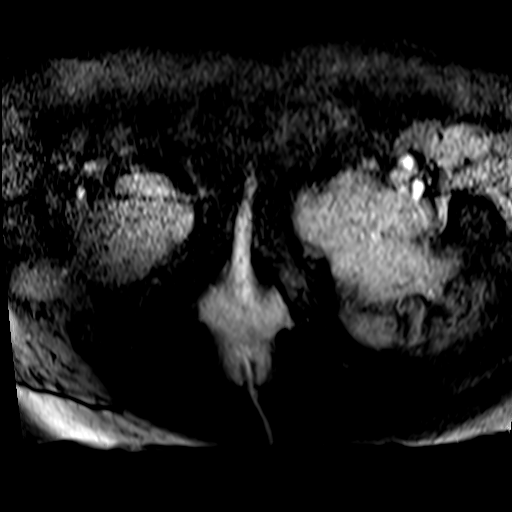
[im 26/52]
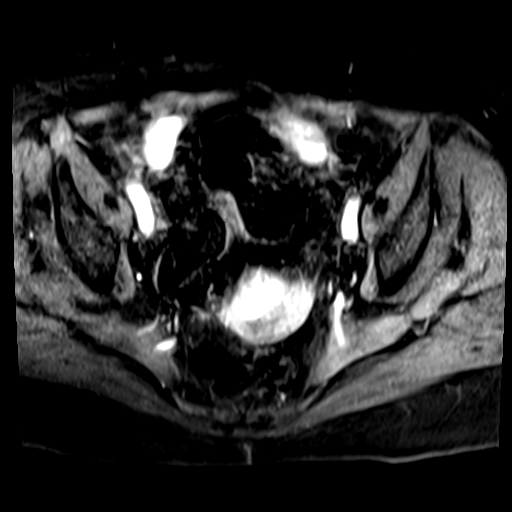
[im 52/52]
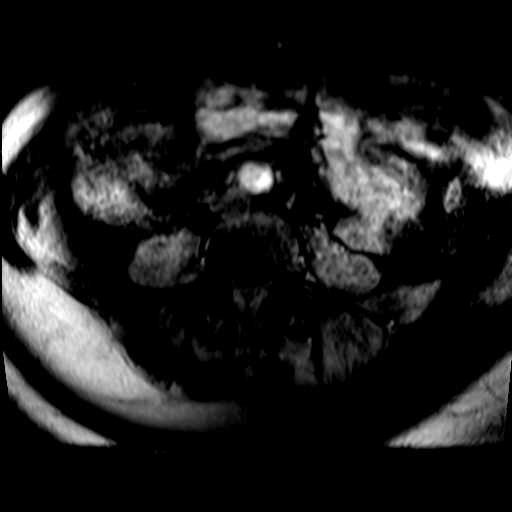

[Series 14: T1 dynamic · axial · 4.0mm · 0.49mm/px · z∈[-200,+3]mm · 3 of 52 slices shown (2 of 2)]
[im 1/52]
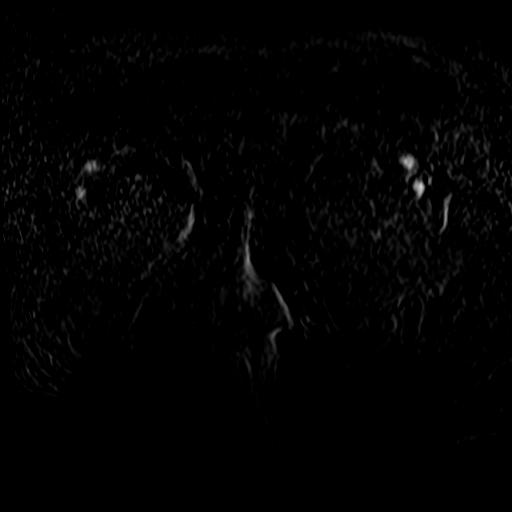
[im 26/52]
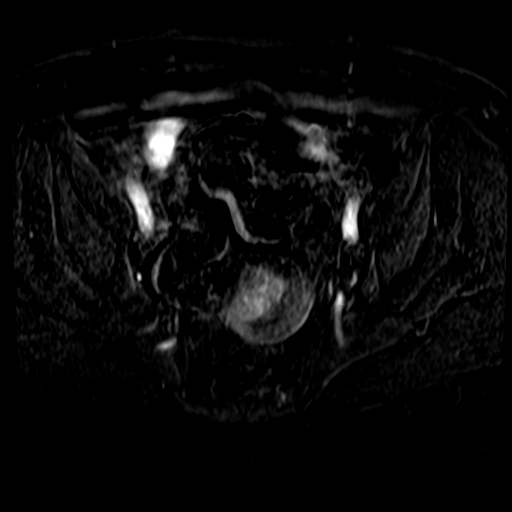
[im 52/52]
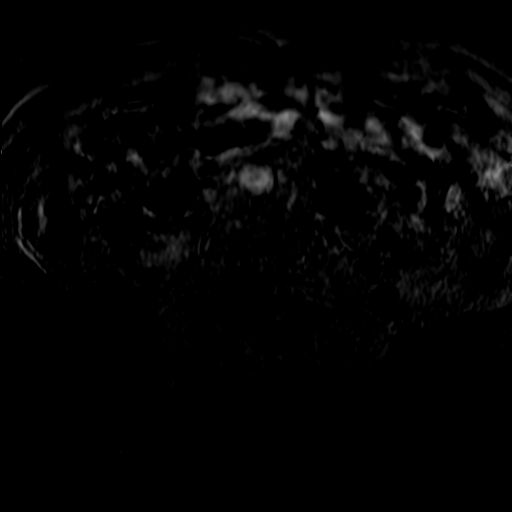

[Series 15: T1 dynamic post-contrast · axial · 4.0mm · 0.49mm/px · 1 of 52 slices shown (2 of 2)]
[im 1/52]
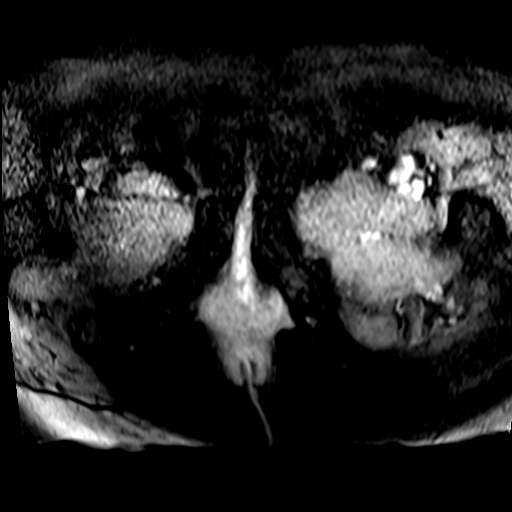

[29 of 48 positions shown; findings below may reference images not displayed]

FINDINGS: Urinary Tract: No distal ureteral dilation. Signs of pelvic floor
dysfunction with cystocele extending below the pubococcygeal line
approximately 1.5 cm.

Bowel: Colonic diverticulosis of the sigmoid colon. No acute bowel
process to the extent evaluated.

Vascular/Lymphatic: Normal caliber of pelvic vasculature. No
adenopathy in the pelvis.

Reproductive: T2 Hypointense lobulated well-marginated lesion in the
fundus of the uterus with smaller adjacent area showing similar
signal characteristics. Dominant area measuring 2.9 x 2.5 cm is
nearly isointense to myometrium on T1. Postcontrast this shows
enhancement slightly above myometrium.

Ovoid T2 hyperintense lesion arising the RIGHT hemipelvis, posterior
and inferior to potentially arising from the inferior RIGHT ovary or
immediately adjacent. Suggestion of very small nodule or peripheral
ovarian tissue along the margin anteriorly though some of this could
be related to artifact from volume averaging (image [DATE]) 5 mm. No
distinct internal enhancement or change from recent imaging
accounting for differences in modality for comparison.

Other:  No ascites.

Musculoskeletal: No suspicious bone lesions identified.
IMPRESSION: 1. RIGHT adnexal cystic lesion not clearly arising from the RIGHT
ovary, potentially a paraovarian cyst. Suggestion of subtle anterior
nodularity on some sequences, though this could be related to volume
averaging, given adjacent colonic diverticuli and surrounding bowel
loops. No internal enhancement is seen postcontrast administration
though these sequences are mildly compromised due to above factors.
Cystic pelvic nerve sheath tumor is another differential
consideration based on location and proximity to neurovascular
structures. No frankly aggressive features are seen. Consider 6-8
week follow-up pelvic ultrasound or comparison with more remote
priors if available elsewhere.
2. Signs of pelvic floor dysfunction with cystocele extending below
the pubococcygeal line approximately 1.5 cm.
3. Uterine leiomyomata.
4. Colonic diverticulosis.

## 2021-02-11 MED ORDER — GADOBENATE DIMEGLUMINE 529 MG/ML IV SOLN
13.0000 mL | Freq: Once | INTRAVENOUS | Status: AC | PRN
  Administered 2021-02-11: 13 mL via INTRAVENOUS

## 2021-03-21 ENCOUNTER — Other Ambulatory Visit: Payer: Self-pay | Admitting: Specialist

## 2021-03-21 DIAGNOSIS — R9389 Abnormal findings on diagnostic imaging of other specified body structures: Secondary | ICD-10-CM

## 2021-03-21 DIAGNOSIS — N838 Other noninflammatory disorders of ovary, fallopian tube and broad ligament: Secondary | ICD-10-CM

## 2021-03-29 ENCOUNTER — Ambulatory Visit
Admission: RE | Admit: 2021-03-29 | Discharge: 2021-03-29 | Disposition: A | Discharge status: Home / Self Care | Payer: Medicare Other | Source: Ambulatory Visit | Attending: Specialist | Admitting: Specialist

## 2021-03-29 ENCOUNTER — Other Ambulatory Visit: Payer: Self-pay

## 2021-03-29 DIAGNOSIS — R9389 Abnormal findings on diagnostic imaging of other specified body structures: Secondary | ICD-10-CM

## 2021-03-29 DIAGNOSIS — N838 Other noninflammatory disorders of ovary, fallopian tube and broad ligament: Secondary | ICD-10-CM

## 2021-03-29 IMAGING — US US PELVIS COMPLETE WITH TRANSVAGINAL
1 series · 13 of 25 positions shown · non-contrast
Comparison: [DATE]

Correlation: MR pelvis [DATE]

CLINICAL DATA: Follow-up ovarian mass, postmenopausal

EXAM:
TRANSABDOMINAL AND TRANSVAGINAL ULTRASOUND OF PELVIS
TECHNIQUE: Both transabdominal and transvaginal ultrasound examinations of the
pelvis were performed. Transabdominal technique was performed for
global imaging of the pelvis including uterus, ovaries, adnexal
regions, and pelvic cul-de-sac. It was necessary to proceed with
endovaginal exam following the transabdominal exam to visualize the
endometrium, ovaries and adnexa.

[Series 1: us pelvis complete with transvaginal · 0.20mm/px · 48 acquisitions, 13 frames shown]
[im 1/48]
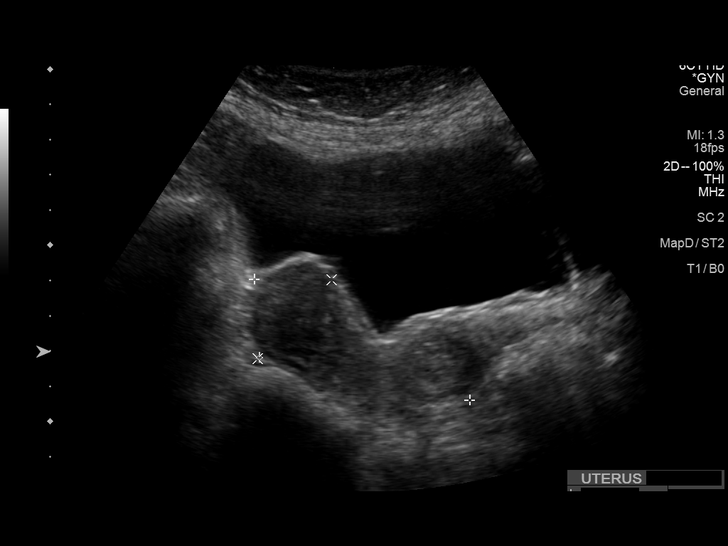
[im 4/48]
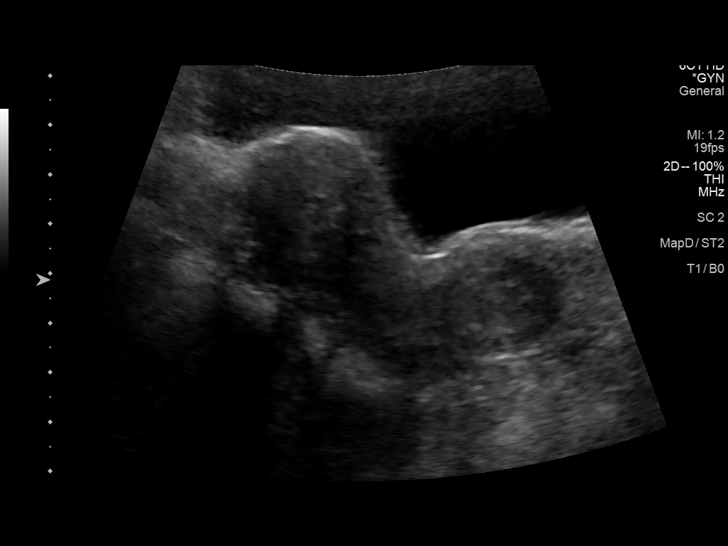
[im 8/48]
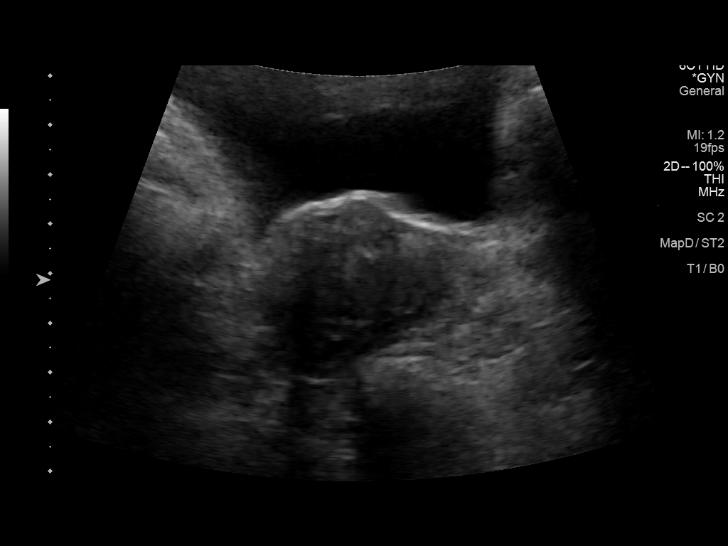
[im 12/48]
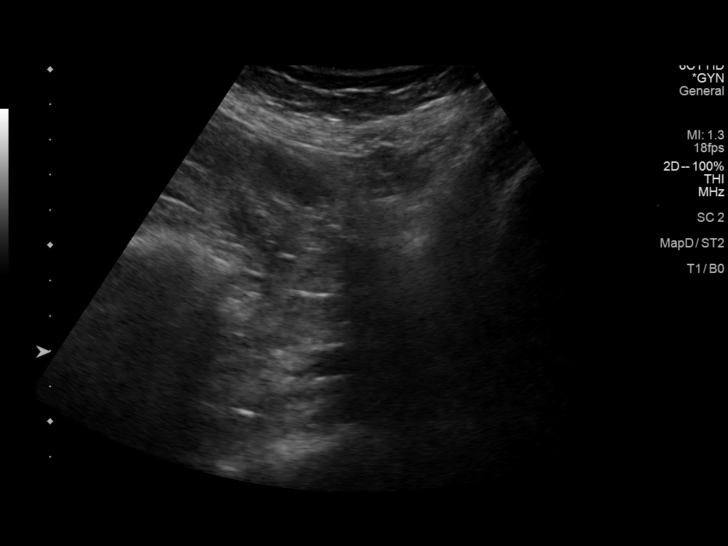
[im 16/48]
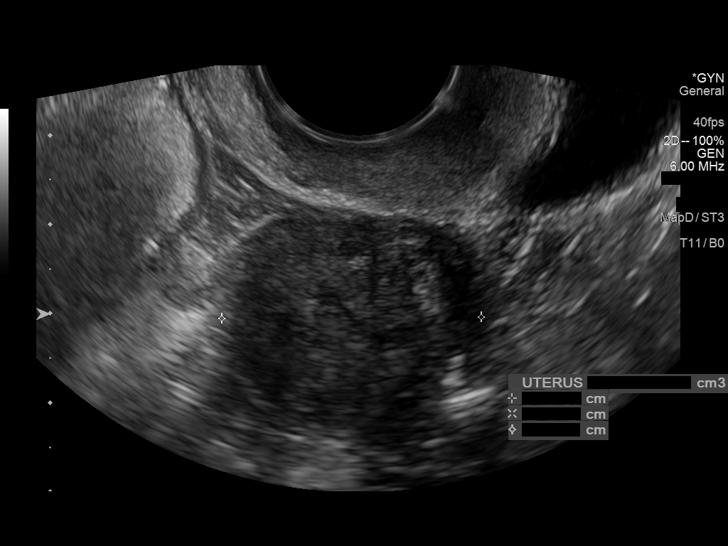
[im 20/48]
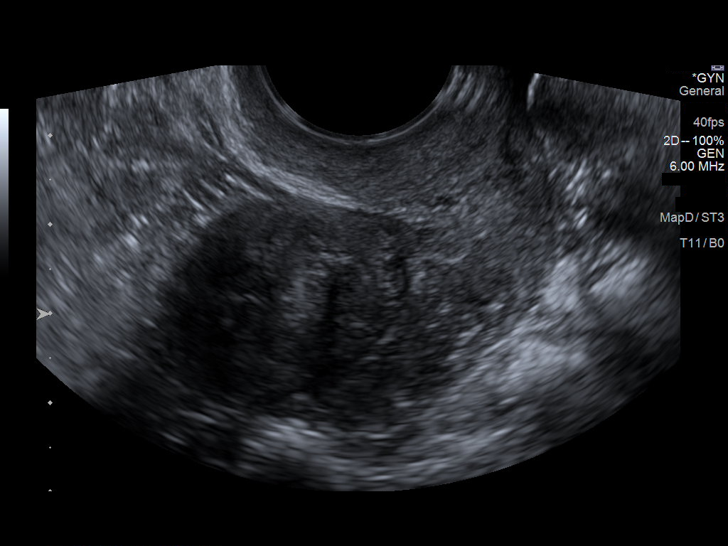
[im 24/48]
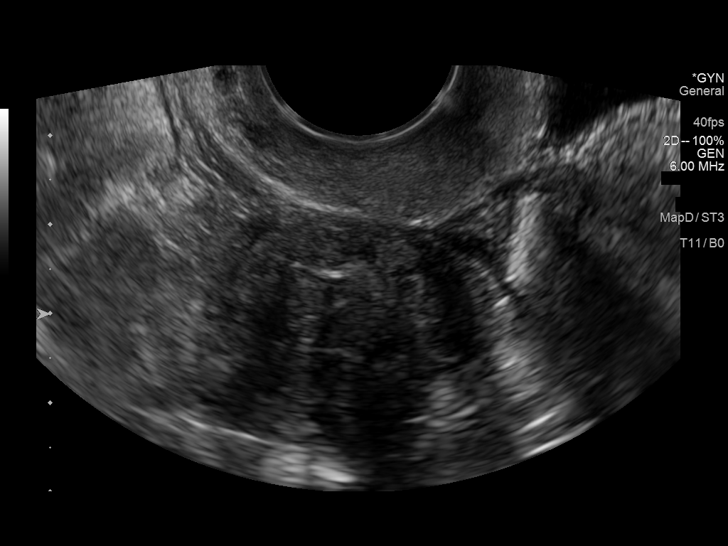
[im 28/48]
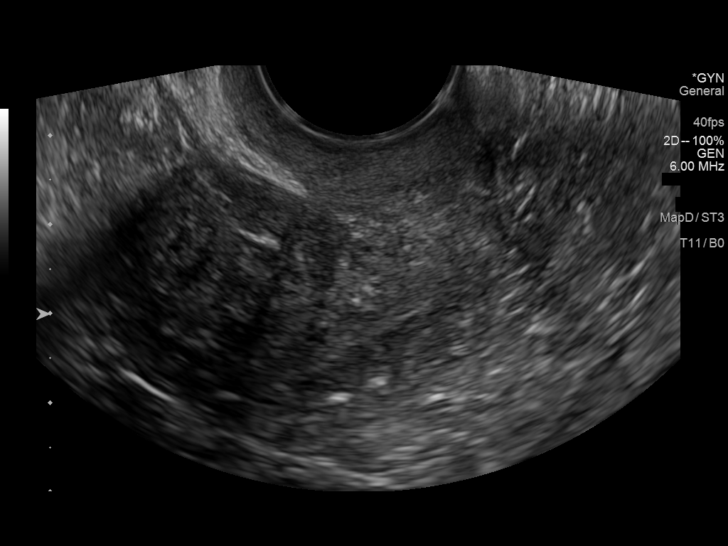
[im 32/48]
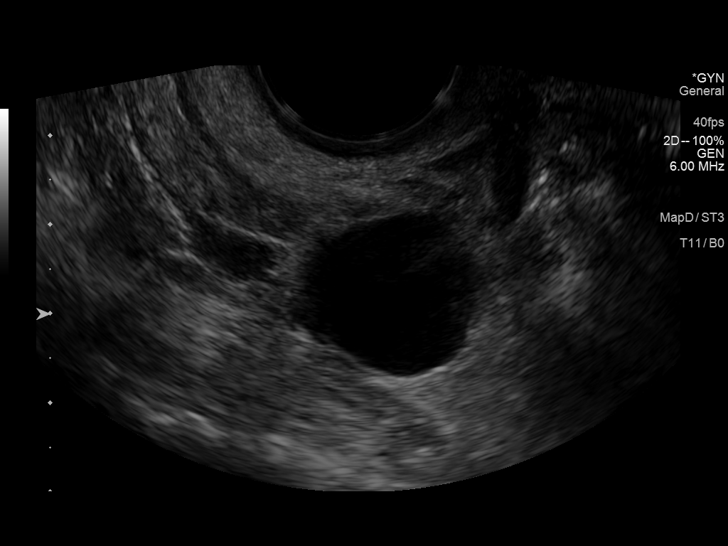
[im 36/48]
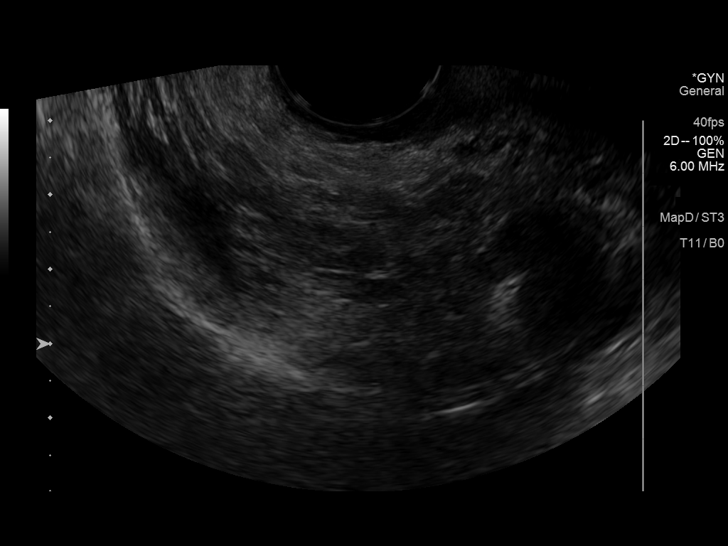
[im 40/48]
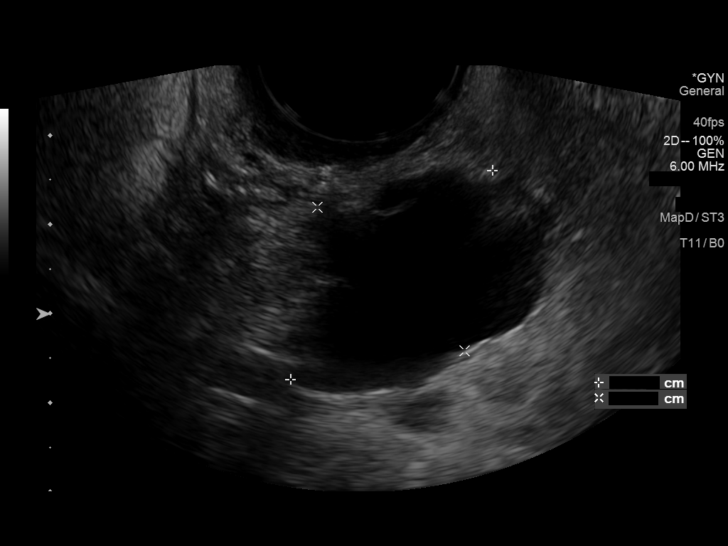
[im 44/48]
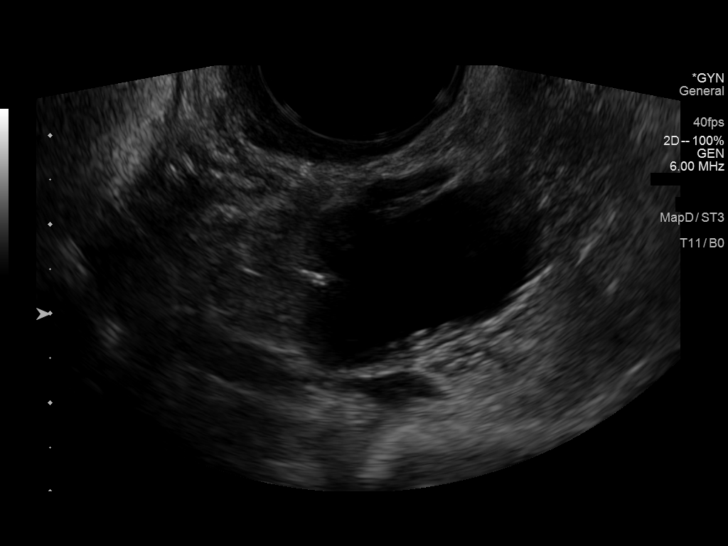
[im 48/48]
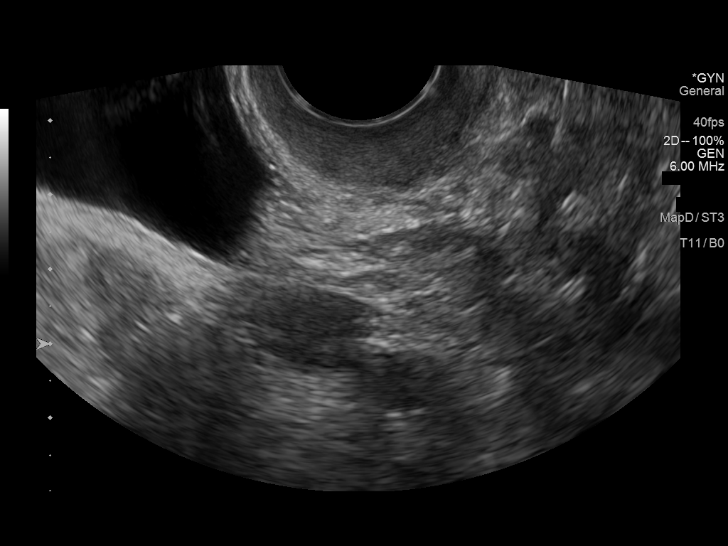

[13 of 25 positions shown; findings below may reference images not displayed]

FINDINGS: Uterus

Measurements: 7.0 x 3.1 x 4.7 cm = volume: 33 mL. Anteverted.
Heterogeneous myometrium. Fundal leiomyoma, subserosal, uncertain if
extends submucosal, 2.8 x 2.5 x 2.6 cm. No additional masses.

Endometrium

Obscured by mass

Right ovary

No normal-appearing RIGHT ovary visualized, see below

Left ovary

Not visualized, likely obscured by bowel

Other findings

No free pelvic fluid. Complex cystic RIGHT adnexal mass identified
3.3 x 2.3 x 2.2 cm in size, previously 2.9 x 2.7 x 2.0 cm,
containing an irregular septation and a slightly irregular wall.
Overall size and appearance are stable versus the previous study. No
new pelvic masses.
IMPRESSION: Stable appearance of complex cystic RIGHT adnexal mass, 3.3 x 2.3 x
2.2 cm, containing an irregular septation and an irregular wall.

By prior MR this is not definitely arising from RIGHT ovary and
could be paraovarian in origin.

Lesion remains indeterminate though stable.

Follow-up transvaginal ultrasound evaluation in 3 months recommended
to assess continued stability.

## 2021-06-19 ENCOUNTER — Other Ambulatory Visit: Payer: Self-pay | Admitting: Specialist

## 2021-06-19 DIAGNOSIS — N838 Other noninflammatory disorders of ovary, fallopian tube and broad ligament: Secondary | ICD-10-CM

## 2021-07-04 ENCOUNTER — Ambulatory Visit
Admission: RE | Admit: 2021-07-04 | Discharge: 2021-07-04 | Disposition: A | Discharge status: Home / Self Care | Payer: Medicare Other | Source: Ambulatory Visit | Attending: Specialist | Admitting: Specialist

## 2021-07-04 DIAGNOSIS — N838 Other noninflammatory disorders of ovary, fallopian tube and broad ligament: Secondary | ICD-10-CM

## 2021-07-04 IMAGING — US US PELVIS COMPLETE WITH TRANSVAGINAL
1 series · 13 of 25 positions shown · non-contrast
Comparison: [DATE]

Correlation: MR pelvis [DATE], CT abdomen and pelvis [DATE]

CLINICAL DATA: Follow-up RIGHT adnexal/ovarian mass



[Series 1: us pelvis complete with transvaginal · 0.24mm/px · 13 of 75 slices shown]
[im 1/75]
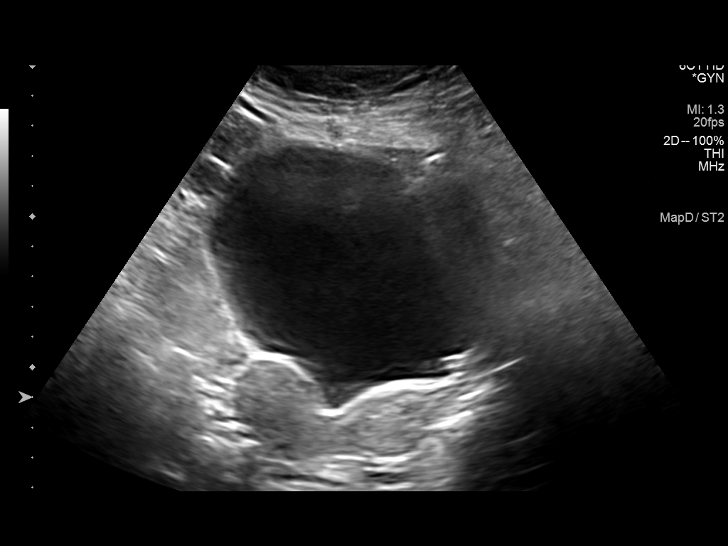
[im 7/75]
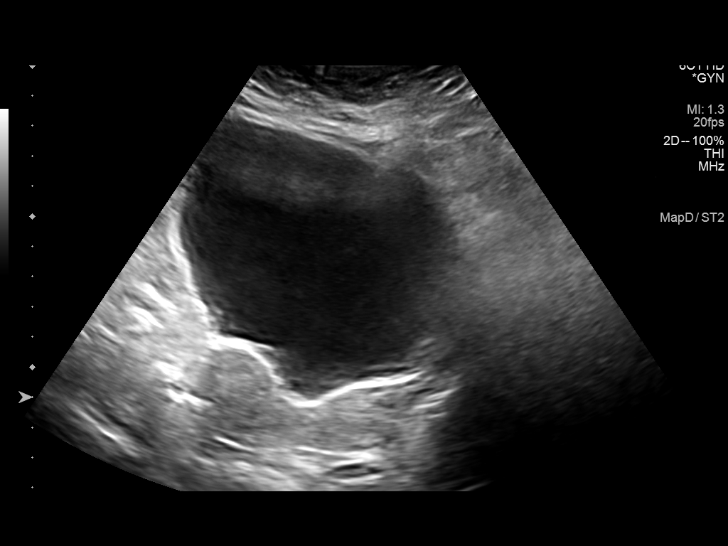
[im 13/75]
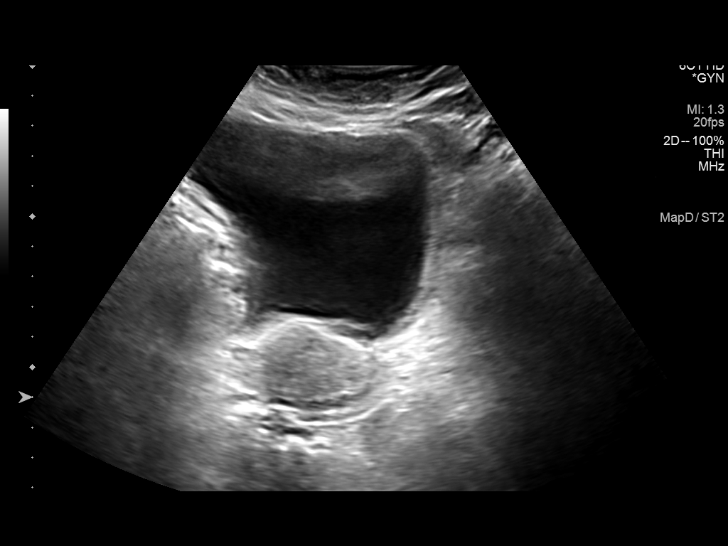
[im 19/75]
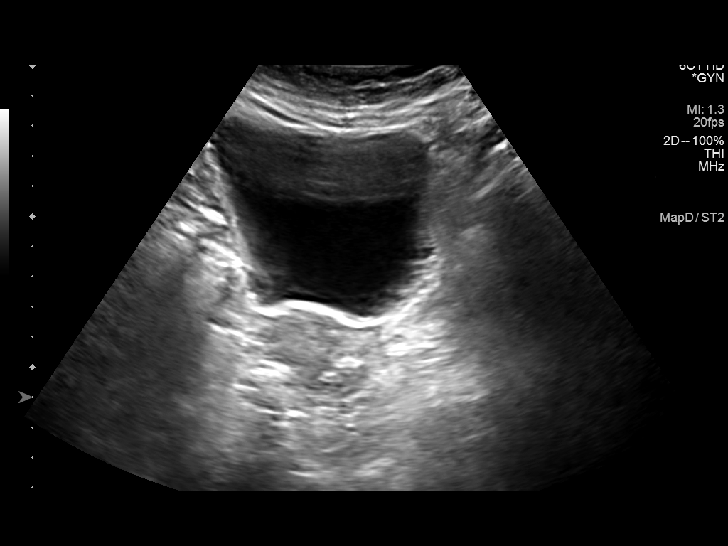
[im 25/75]
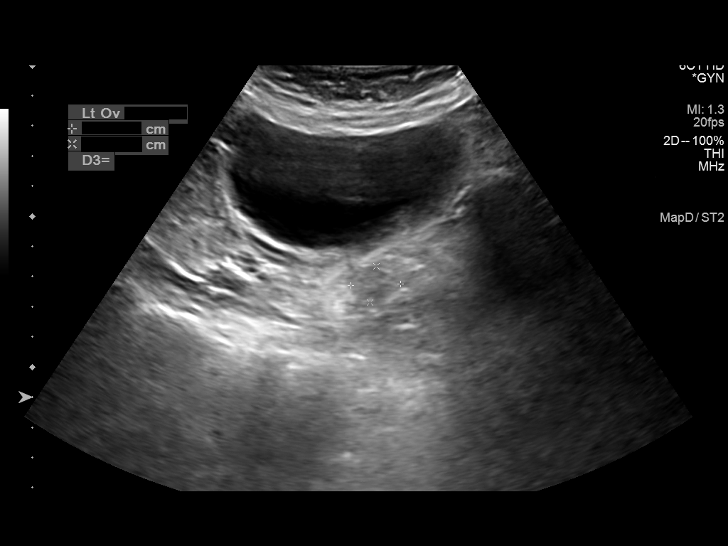
[im 31/75]
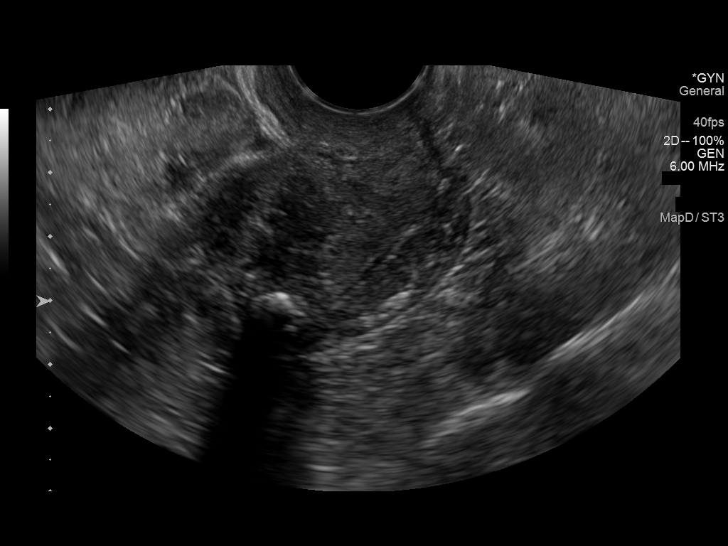
[im 38/75]
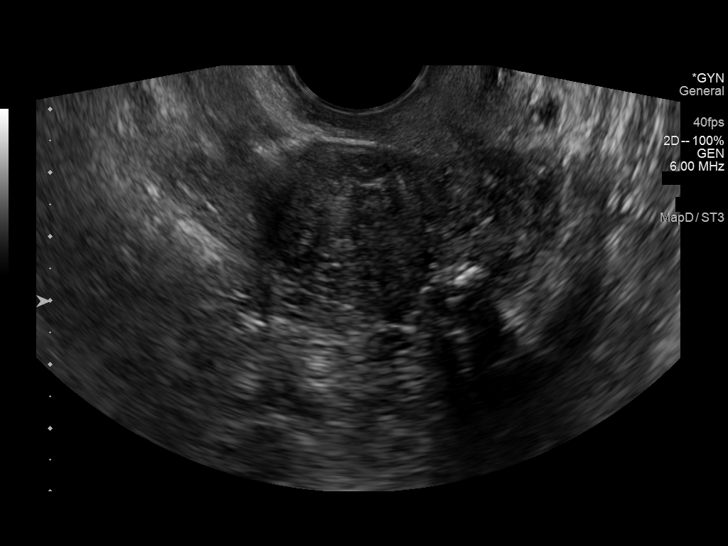
[im 44/75]
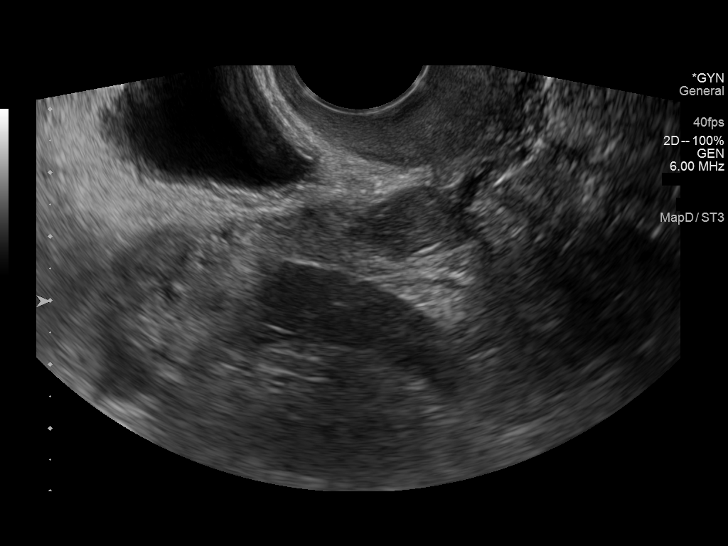
[im 50/75]
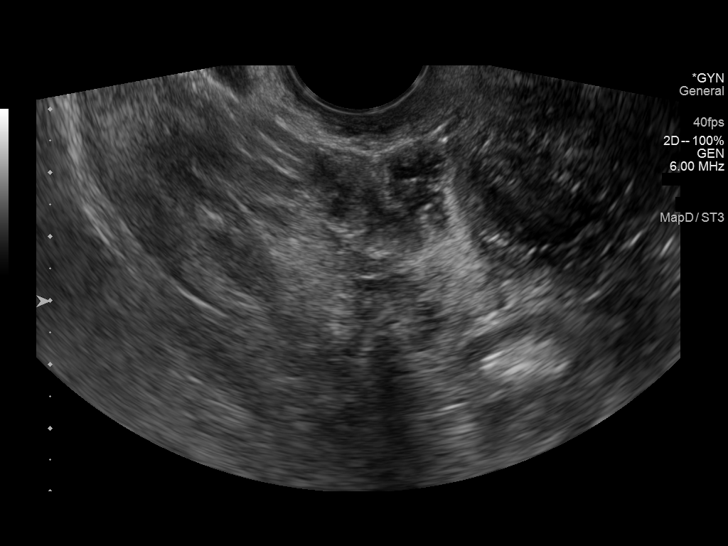
[im 56/75]
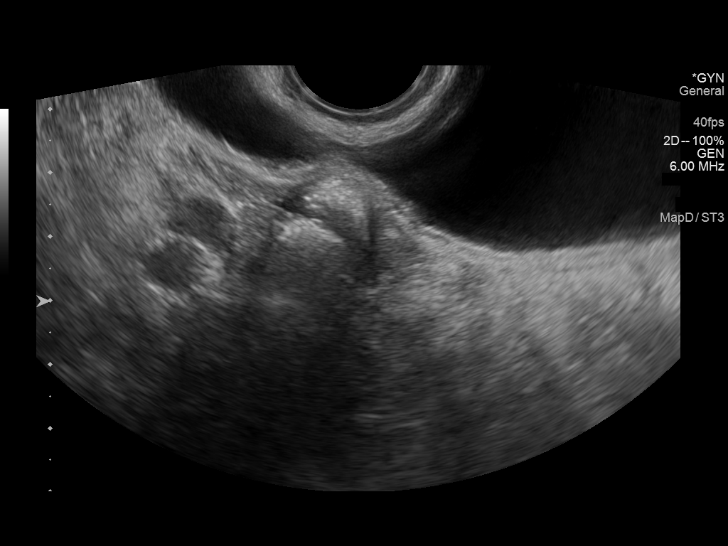
[im 62/75]
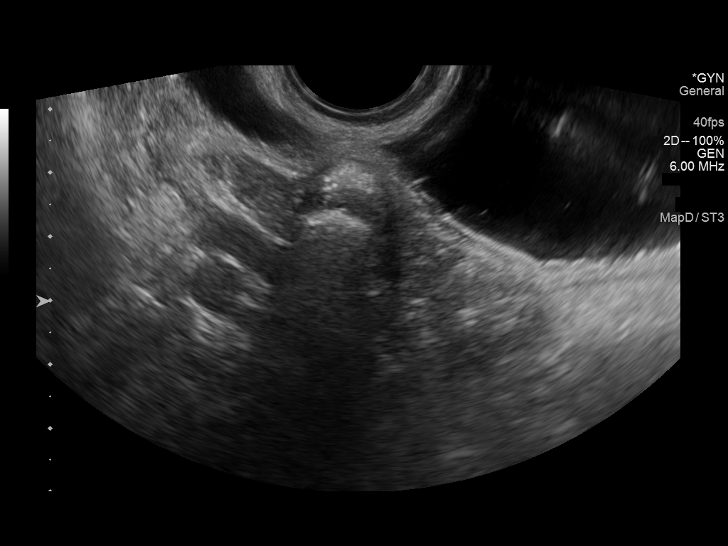
[im 68/75]
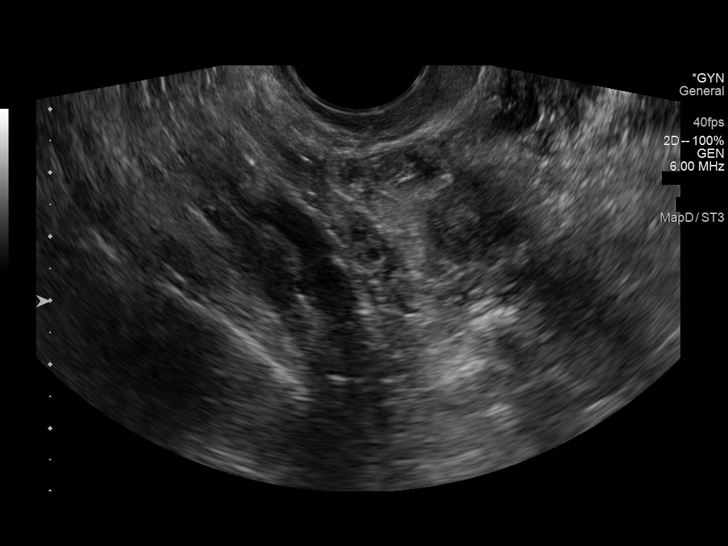
[im 75/75]
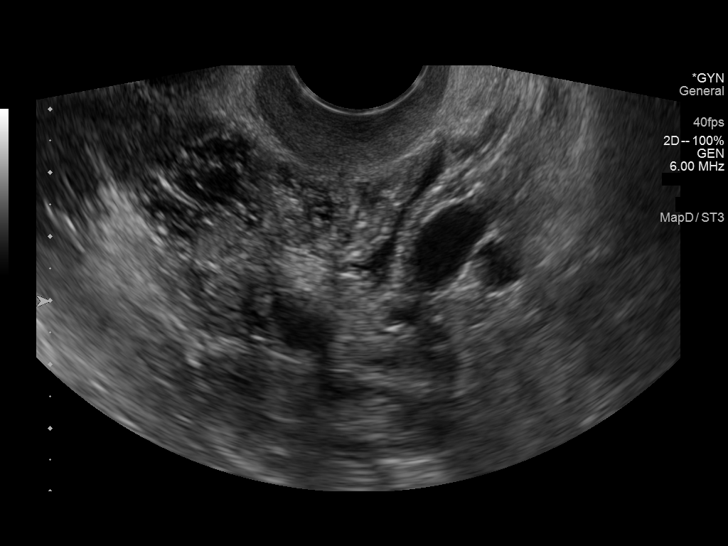

[13 of 25 positions shown; findings below may reference images not displayed]

FINDINGS: Uterus

Measurements: 6.8 x 2.6 x 4.5 cm = volume: 42 mL. Anteverted.
Anterior fundal leiomyoma, 2.6 x 1.7 x 2.4 cm. Additional anterior
wall leiomyoma likely submucosal 2.3 x 1.8 x 2.1 cm. Heterogeneous
myometrium.

Endometrium

Thickness: 2 mm.  No endometrial fluid or mass

Right ovary

Measurements: 1.6 x 0.7 x 1.2 cm = volume: 0.7 mL. Normal morphology
without mass

Left ovary

Measurements: 1.8 x 1.1 x 1.1 cm = volume: 1.1 mL. Normal morphology
without mass

Other findings

No free pelvic fluid. No definite pelvic masses. Cystic lesion seen
in the RIGHT adnexa on the prior CT and MR exams is not
sonographically identified. Bowel loops are present in the LEFT
adnexa.
IMPRESSION: Two uterine leiomyomata, larger of which is 2.6 cm in greatest size
and likely submucosal.

Unremarkable endometrial complex and ovaries.

Previously identified cystic lesion in the RIGHT adnexa is not
sonographically identified, likely due to bowel loops.

If assessment of stability of this RIGHT adnexal lesion is required,
recommend MR imaging.

## 2021-08-16 DIAGNOSIS — H6123 Impacted cerumen, bilateral: Secondary | ICD-10-CM | POA: Insufficient documentation

## 2021-08-16 HISTORY — DX: Impacted cerumen, bilateral: H61.23

## 2021-09-07 ENCOUNTER — Ambulatory Visit (INDEPENDENT_AMBULATORY_CARE_PROVIDER_SITE_OTHER): Payer: Medicare Other | Admitting: Neurology

## 2021-09-07 ENCOUNTER — Telehealth: Payer: Self-pay | Admitting: Neurology

## 2021-09-07 ENCOUNTER — Encounter: Payer: Self-pay | Admitting: Neurology

## 2021-09-07 VITALS — BP 159/94 | HR 80 | Ht <= 58 in | Wt 127.5 lb

## 2021-09-07 DIAGNOSIS — E785 Hyperlipidemia, unspecified: Secondary | ICD-10-CM | POA: Diagnosis not present

## 2021-09-07 DIAGNOSIS — R7309 Other abnormal glucose: Secondary | ICD-10-CM | POA: Diagnosis not present

## 2021-09-07 DIAGNOSIS — I10 Essential (primary) hypertension: Secondary | ICD-10-CM | POA: Diagnosis not present

## 2021-09-07 DIAGNOSIS — I6350 Cerebral infarction due to unspecified occlusion or stenosis of unspecified cerebral artery: Secondary | ICD-10-CM | POA: Diagnosis not present

## 2021-09-07 MED ORDER — CLOPIDOGREL BISULFATE 75 MG PO TABS: 75.0000 mg | ORAL_TABLET | Freq: Every day | ORAL | 11 refills | Status: DC

## 2021-09-07 NOTE — Telephone Encounter (Signed)
Medicare/bcbs supp order sent to [facility], [facility] they will reach out to the patient to schedule. 

## 2021-09-07 NOTE — Progress Notes (Signed)
[facility]  PATIENT: Haley Pham DOB: 1945-09-01  REQUESTING CLINICIAN: Warden Fillers, MD HISTORY FROM: Patient and husband  REASON FOR VISIT: Occipital stroke    HISTORICAL  CHIEF COMPLAINT:  Chief Complaint  Patient presents with   New Patient (Initial Visit)    Rm 13. Accompanied by husband, "Tommy". NP/[facility]/Christopher Groat MD/congruous VF defect indicating past stroke.    HISTORY OF PRESENT ILLNESS:  This is a 76 year old woman past medical history of hypertension, hyperlipidemia, rheumatoid arthritis and hypothyroidism who was referred to neurology by her ophthalmologist.  Patient reports having an eye exam and was told on the eye exam that she had a visual field defect.  Her ophthalmologist was concerned for an occipital lobe stroke therefore he ordered an MRI brain and refer the patient to neurology.  MRI brain was obtained and it showed a right occipital stroke which likely explain her left visual field cut.  Patient denies any deficit prior, cannot think of when she had a stroke because she has been her normal self during all this time.  Currently she denies any deficit.  She reports not having seen her ophthalmologist for the past 2 years, she noted that her left eye vision was bad but related to cataract.   She is on aspirin, she is on lipid-lowering agent and blood pressure medication also.     OTHER MEDICAL CONDITIONS: HTN, RA, Hypothyroidism, HLD    REVIEW OF SYSTEMS: Full 14 system review of systems performed and negative with exception of: as noted in the HPI   ALLERGIES: No Known Allergies  HOME MEDICATIONS: Outpatient Medications Prior to Visit  Medication Sig Dispense Refill   Abatacept (ORENCIA IV) Inject into the skin. Every 4 weeks     acetaminophen (TYLENOL) 500 MG tablet Take 500 mg by mouth as needed.     albuterol (PROVENTIL HFA;VENTOLIN HFA) 108 (90 Base) MCG/ACT inhaler Inhale 1 puff into the lungs every  6 (six) hours as needed for wheezing or shortness of breath.     albuterol (PROVENTIL) (2.5 MG/3ML) 0.083% nebulizer solution Take 2.5 mg by nebulization every 6 (six) hours as needed for wheezing or shortness of breath.     alendronate (FOSAMAX) 70 MG tablet TAKE 1 TABLET BY MOUTH ONE TIME PER WEEK     ALPRAZolam (XANAX) 0.5 MG tablet Take 0.5 mg by mouth. Take 1/2 tablet at bedtime     amLODipine (NORVASC) 10 MG tablet Take 10 mg by mouth daily.     aspirin 81 MG tablet Take 81 mg by mouth daily.     Calcium Carbonate-Vitamin D 600-200 MG-UNIT TABS Take by mouth.     CALCIUM-VITAMIN D PO Take by mouth. Take 2 daily     co-enzyme Q-10 30 MG capsule Take 30 mg by mouth daily.     docusate sodium (COLACE) 100 MG capsule Take 100 mg by mouth daily.     folic acid (FOLVITE) 1 MG tablet Take 1 mg by mouth daily.     hydroxychloroquine (PLAQUENIL) 200 MG tablet Take by mouth 2 (two) times daily.     levothyroxine (SYNTHROID, LEVOTHROID) 137 MCG tablet Take 137 mcg by mouth daily before breakfast. Every other day alternates with the 150 mcg     levothyroxine (SYNTHROID, LEVOTHROID) 150 MCG tablet Take 150 mcg by mouth. Every other day     Lutein 20 MG CAPS Take by mouth.     meloxicam (MOBIC) 15 MG tablet Take by mouth.  Methotrexate, PF, 25 MG/0.5ML SOAJ Inject into the skin once a week.     montelukast (SINGULAIR) 10 MG tablet Take 10 mg by mouth at bedtime.     Multiple Vitamins-Minerals (CENTRUM SILVER PO) Take by mouth daily.     Omega 3 1200 MG CAPS Take by mouth. Take 2 a day     omeprazole (PRILOSEC) 20 MG capsule Take 20 mg by mouth daily.     predniSONE (DELTASONE) 5 MG tablet TK 0.5T PO QD     simvastatin (ZOCOR) 20 MG tablet Take 20 mg by mouth daily.     triamcinolone cream (KENALOG) 0.1 % triamcinolone acetonide 0.1 % topical cream     valsartan (DIOVAN) 160 MG tablet Take 160 mg by mouth daily.     No facility-administered medications prior to visit.    PAST MEDICAL  HISTORY: Past Medical History:  Diagnosis Date   Asthma    Cataract    right removed, left formin    Constipation    "my whole life" uses colace daily- soft stools    Heart murmur    Hyperlipidemia    Hypertension    Osteoporosis    Rheumatoid arthritis ([place])    Thyroid disease    Tubulovillous adenoma of colon 2016    PAST SURGICAL HISTORY: Past Surgical History:  Procedure Laterality Date   CATARACT EXTRACTION Right 2017   COLONOSCOPY     MOUTH SURGERY     impacted teeth   POLYPECTOMY      FAMILY HISTORY: Family History  Problem Relation Age of Onset   Pancreatic cancer Mother    Asthma Mother    Hypertension Mother    Heart disease Father    Colon cancer Neg Hx    Esophageal cancer Neg Hx    Rectal cancer Neg Hx    Stomach cancer Neg Hx    Colon polyps Neg Hx     SOCIAL HISTORY: Social History   Socioeconomic History   Marital status: Married    Spouse name: Not on file   Number of children: Not on file   Years of education: Not on file   Highest education level: Not on file  Occupational History   Not on file  Tobacco Use   Smoking status: Former   Smokeless tobacco: Never   Tobacco comments:    smoke for about 6 yrs in her 20"s  Substance and Sexual Activity   Alcohol use: No    Alcohol/week: 0.0 standard drinks   Drug use: No   Sexual activity: Not on file  Other Topics Concern   Not on file  Social History Narrative   Not on file   Social Determinants of Health   Financial Resource Strain: Not on file  Food Insecurity: Not on file  Transportation Needs: Not on file  Physical Activity: Not on file  Stress: Not on file  Social Connections: Not on file  Intimate Partner Violence: Not on file    PHYSICAL EXAM  GENERAL EXAM/CONSTITUTIONAL: Vitals:  Vitals:   09/07/21 1249  BP: (!) 159/94  Pulse: 80  Weight: 127 lb 8 oz (57.8 kg)  Height: 4\' 10"  (1.473 m)   Body mass index is 26.65 kg/m. Wt Readings from Last 3 Encounters:   09/07/21 127 lb 8 oz (57.8 kg)  01/28/19 149 lb (67.6 kg)  10/21/18 155 lb (70.3 kg)   Patient is in no distress; well developed, nourished and groomed; neck is supple  CARDIOVASCULAR: Examination of carotid arteries  is normal; no carotid bruits Regular rate and rhythm, no murmurs Examination of peripheral vascular system by observation and palpation is normal  EYES: Pupils round and reactive to light, Visual fields full to confrontation, Extraocular movements intacts,   MUSCULOSKELETAL: Gait, strength, tone, movements noted in Neurologic exam below  NEUROLOGIC: MENTAL STATUS:  No flowsheet data found. awake, alert, oriented to person, place and time recent and remote memory intact normal attention and concentration language fluent, comprehension intact, naming intact fund of knowledge appropriate  CRANIAL NERVE:  2nd, 3rd, 4th, 6th - pupils equal and reactive to light, visual fields full to confrontation, extraocular muscles intact, no nystagmus 5th - facial sensation symmetric 7th - facial strength symmetric 8th - hearing intact 9th - palate elevates symmetrically, uvula midline 11th - shoulder shrug symmetric 12th - tongue protrusion midline  MOTOR:  normal bulk and tone, full strength in the BUE, BLE  SENSORY:  normal and symmetric to light touch, pinprick, temperature, vibration  COORDINATION:  finger-nose-finger, fine finger movements normal  REFLEXES:  deep tendon reflexes present and symmetric  GAIT/STATION:  normal     DIAGNOSTIC DATA (LABS, IMAGING, TESTING) - I reviewed patient records, labs, notes, testing and imaging myself where available.  No results found for: WBC, HGB, HCT, MCV, PLT No results found for: NA, K, CL, CO2, GLUCOSE, BUN, CREATININE, CALCIUM, PROT, ALBUMIN, AST, ALT, ALKPHOS, BILITOT, GFRNONAA, GFRAA No results found for: CHOL, HDL, LDLCALC, LDLDIRECT, TRIG, CHOLHDL No results found for: HGBA1C No results found for:  VITAMINB12 No results found for: TSH   MRI Brain 09/01/2021 1.  Small remote infarct in the right medial occipital lobe. No acute infarct.  2.  Mild chronic small vessel ischemic change.  3.  Acute left maxillary sinusitis with fluid.     ASSESSMENT AND PLAN  76 y.o. year old female with vascular risk factor including hypertension hyperlipidemia who is presenting after being found to have a left visual cut during a routine ophthalmology evaluation.  Initial work-up showed a right medial occipital lobe stroke.  There is also finding of mild chronic small vessel disease.  Stroke etiology likely large vessel disease but cannot rule out cardioembolic.  I will obtain stroke labs: hemoglobin A1c and lipid and also obtain a CT angiogram head and neck.  I have started the patient on Plavix, she had failed aspirin.  Initially will do dual antiplatelet therapy for total of 3 months then patient will discontinue aspirin and continue with Plavix thereafter.  I will contact the patient to go over the result and make changes if needed.  I will see her in 1 year for follow-up.  Discussed stroke risk factors and ways to decrease incidence of strokes.   1. 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 Hypertension, unspecified type   3. Hyperlipidemia, unspecified hyperlipidemia type   4. Other abnormal glucose      Patient Instructions  Start with Plavix 75 mg daily  Take Aspirin and Plavix for a total of 3 months then continue with Plavix thereafter  Discontinue Aspirin after 3 months  We will check stroke labs with CMP  CTA Head and Neck  Follow up with your PCP for an alternative to Omeprazole (Interact with Plavix)  Follow up in 1 year   Orders Placed This Encounter  Procedures   CT ANGIO HEAD W OR WO CONTRAST   CT ANGIO NECK W OR WO CONTRAST   CMP   Hemoglobin A1c   Lipid Panel    Meds  ordered this encounter  Medications   clopidogrel (PLAVIX) 75 MG tablet    Sig: Take 1 tablet (75 mg  total) by mouth daily.    Dispense:  30 tablet    Refill:  11    Return in about 1 year (around 09/07/2022).    Alric Ran, MD 09/07/2021, 3:06 PM  [facility] [address], [place], [place] [address] [phone]

## 2021-09-07 NOTE — Patient Instructions (Addendum)
Start with Plavix 75 mg daily  Take Aspirin and Plavix for a total of 3 months then continue with Plavix thereafter  Discontinue Aspirin after 3 months  We will check stroke labs with CMP  CTA Head and Neck  Follow up with your PCP for an alternative to Omeprazole (Interact with Plavix)  Follow up in 1 year

## 2021-09-08 LAB — LIPID PANEL
Chol/HDL Ratio: 1.9 ratio (ref 0.0–4.4)
Cholesterol, Total: 161 mg/dL (ref 100–199)
HDL: 84 mg/dL (ref >39)
LDL Chol Calc (NIH): 58 mg/dL (ref 0–99)
Triglycerides: 112 mg/dL (ref 0–149)
VLDL Cholesterol Cal: 19 mg/dL (ref 5–40)

## 2021-09-08 LAB — COMPREHENSIVE METABOLIC PANEL
ALT: 22 IU/L (ref 0–32)
AST: 22 IU/L (ref 0–40)
Albumin/Globulin Ratio: 2.5 — ABNORMAL HIGH (ref 1.2–2.2)
Albumin: 5 g/dL — ABNORMAL HIGH (ref 3.7–4.7)
Alkaline Phosphatase: 54 IU/L (ref 44–121)
BUN/Creatinine Ratio: 18 (ref 12–28)
BUN: 13 mg/dL (ref 8–27)
Bilirubin Total: <0.2 mg/dL (ref 0.0–1.2)
CO2: 26 mmol/L (ref 20–29)
Calcium: 10.2 mg/dL (ref 8.7–10.3)
Chloride: 98 mmol/L (ref 96–106)
Creatinine, Ser: 0.72 mg/dL (ref 0.57–1.00)
Globulin, Total: 2 g/dL (ref 1.5–4.5)
Glucose: 96 mg/dL (ref 70–99)
Potassium: 4.5 mmol/L (ref 3.5–5.2)
Sodium: 138 mmol/L (ref 134–144)
Total Protein: 7 g/dL (ref 6.0–8.5)
eGFR: 87 mL/min/{1.73_m2} (ref >59)

## 2021-09-08 LAB — HEMOGLOBIN A1C
Est. average glucose Bld gHb Est-mCnc: 120 mg/dL
Hgb A1c MFr Bld: 5.8 % — ABNORMAL HIGH (ref 4.8–5.6)

## 2021-09-11 ENCOUNTER — Telehealth: Payer: Self-pay | Admitting: Neurology

## 2021-09-11 ENCOUNTER — Telehealth: Payer: Self-pay | Admitting: *Deleted

## 2021-09-11 NOTE — Telephone Encounter (Signed)
-----   Message from Alric Ran, MD sent at 09/11/2021  8:37 AM EST ----- Please call and advise the patient that the stroke labs we checked were within normal limits. We checked blood count, diabetes marker and cholesterol panel. No further action is required on these tests at this time, continue current medications. Please remind patient to keep any upcoming appointments or tests and to call us with any interim questions, concerns, problems or updates. Thanks,   Alric Ran, MD

## 2021-09-11 NOTE — Progress Notes (Signed)
Please call and advise the patient that the stroke labs we checked were within normal limits. We checked blood count, diabetes marker and cholesterol panel. No further action is required on these tests at this time, continue current medications. Please remind patient to keep any upcoming appointments or tests and to call us with any interim questions, concerns, problems or updates. Thanks,   Alric Ran, MD

## 2021-09-11 NOTE — Telephone Encounter (Signed)
See other phone note

## 2021-09-11 NOTE — Telephone Encounter (Signed)
Pt inquiring about bloodwork results because having a CT scan and angiogram on 09/12/21. Pt said, Dr. April Manson wanted the bloodwork results before test at [facility]. Would like a call from the nurse.

## 2021-09-12 ENCOUNTER — Other Ambulatory Visit: Payer: Medicare Other

## 2021-09-12 ENCOUNTER — Ambulatory Visit
Admission: RE | Admit: 2021-09-12 | Discharge: 2021-09-12 | Disposition: A | Discharge status: Home / Self Care | Payer: Medicare Other | Source: Ambulatory Visit | Attending: Neurology | Admitting: Neurology

## 2021-09-12 DIAGNOSIS — I6350 Cerebral infarction due to unspecified occlusion or stenosis of unspecified cerebral artery: Secondary | ICD-10-CM

## 2021-09-12 DIAGNOSIS — I10 Essential (primary) hypertension: Secondary | ICD-10-CM

## 2021-09-12 DIAGNOSIS — E785 Hyperlipidemia, unspecified: Secondary | ICD-10-CM

## 2021-09-12 MED ORDER — IOPAMIDOL (ISOVUE-370) INJECTION 76%
75.0000 mL | Freq: Once | INTRAVENOUS | Status: AC | PRN
  Administered 2021-09-12: 75 mL via INTRAVENOUS

## 2021-09-12 NOTE — Telephone Encounter (Signed)
Attempted to call pt, LVM for normal results per DPR. Ask pt to call back for questions or concerns.  

## 2021-09-13 ENCOUNTER — Telehealth: Payer: Self-pay | Admitting: *Deleted

## 2021-09-13 NOTE — Telephone Encounter (Signed)
-----   Message from Alric Ran, MD sent at 09/13/2021 10:28 AM EST ----- Please call and inform patient that vessel images of the head and neck were normal, no signs of stenosis, no occlusion. Please continue current medications and follow up as scheduled.   Dr. April Manson

## 2021-09-13 NOTE — Progress Notes (Signed)
Please call and inform patient that vessel images of the head and neck were normal, no signs of stenosis, no occlusion. Please continue current medications and follow up as scheduled.   Dr. April Manson

## 2021-09-13 NOTE — Telephone Encounter (Signed)
Left patient a detailed message, with results, on voicemail (ok per DPR).  Provided our number to call back with any questions.  

## 2021-10-03 DIAGNOSIS — Z8673 Personal history of transient ischemic attack (TIA), and cerebral infarction without residual deficits: Secondary | ICD-10-CM

## 2021-10-03 HISTORY — DX: Personal history of transient ischemic attack (TIA), and cerebral infarction without residual deficits: Z86.73

## 2021-11-23 DIAGNOSIS — Z79899 Other long term (current) drug therapy: Secondary | ICD-10-CM

## 2021-11-23 DIAGNOSIS — E669 Obesity, unspecified: Secondary | ICD-10-CM

## 2021-11-23 DIAGNOSIS — Z6830 Body mass index (BMI) 30.0-30.9, adult: Secondary | ICD-10-CM

## 2021-11-23 HISTORY — DX: Obesity, unspecified: E66.9

## 2021-11-23 HISTORY — DX: Other long term (current) drug therapy: Z79.899

## 2021-11-23 HISTORY — DX: Body mass index (BMI) 30.0-30.9, adult: Z68.30

## 2021-11-24 ENCOUNTER — Encounter: Payer: Self-pay | Admitting: Cardiology

## 2021-11-24 ENCOUNTER — Ambulatory Visit (INDEPENDENT_AMBULATORY_CARE_PROVIDER_SITE_OTHER): Payer: Medicare Other | Admitting: Cardiology

## 2021-11-24 ENCOUNTER — Ambulatory Visit (INDEPENDENT_AMBULATORY_CARE_PROVIDER_SITE_OTHER): Payer: Medicare Other

## 2021-11-24 VITALS — BP 122/74 | HR 79 | Ht <= 58 in | Wt 126.6 lb

## 2021-11-24 DIAGNOSIS — I10 Essential (primary) hypertension: Secondary | ICD-10-CM

## 2021-11-24 DIAGNOSIS — I6930 Unspecified sequelae of cerebral infarction: Secondary | ICD-10-CM | POA: Diagnosis not present

## 2021-11-24 DIAGNOSIS — R002 Palpitations: Secondary | ICD-10-CM

## 2021-11-24 DIAGNOSIS — R7303 Prediabetes: Secondary | ICD-10-CM | POA: Diagnosis not present

## 2021-11-24 DIAGNOSIS — E785 Hyperlipidemia, unspecified: Secondary | ICD-10-CM | POA: Insufficient documentation

## 2021-11-24 DIAGNOSIS — R011 Cardiac murmur, unspecified: Secondary | ICD-10-CM

## 2021-11-24 NOTE — Progress Notes (Signed)
? ?Cardiology Consultation:   ? ?Date:  11/24/2021  ? ?ID:  Haley Pham, DOB 07-29-1946, MRN [id number] ? ?PCP:  Mayer Camel, NP  ?Cardiologist:  Jenne Campus, MD  ? ?Referring MD: Raina Mina., MD  ? ?Chief Complaint  ?Patient presents with  ? stroke  ? ? ?History of Present Illness:   ? ?Haley Pham is a 76 y.o. female who is being seen today for the evaluation of CVA, rule out cardiac source of emboli at the request of Raina Mina., MD. she is a delightful lady with past medical history significant for essential hypertension, hyperlipidemia, rheumatoid arthritis, hypothyroidism.  She was referred to [place] because she went to have her eyes checked and she was noted to have visual field defect.  After the MRI has been performed which showed right occipital stroke and she was referred to neurology who evaluated her and concluded that there is a chance that she may have cardiac source of emboli.  Haley Pham is completely unaware of her field of view issues, she is does not know when the stroke happened.  Apparently she did see ophthalmologist 2 years before last visit so clearly she did have some embolic event within the last 2 years or so.  She is doing well she does have difficulty walking around because of advanced rheumatoid arthritis, she uses a walker.  She said she can go to [facility] which have to stop.  She denies have any chest pain tightness squeezing pressure burning chest.  Years ago she was told to have heart murmur, at that time left atrium was mildly enlarged, no significant valvular pathology detected ?She does not smoke ?She does have family history of premature coronary disease ? ?Past Medical History:  ?Diagnosis Date  ? Acquired hypothyroidism 10/03/2015  ? Last Assessment & Plan:  Formatting of this note might be different from the original. Relevant Hx: Course: Daily Update: Today's Plan:she will return for this to be repeated  Electronically signed by: Mayer Camel, NP 01/27/16 2311  ? Adult-onset obesity 11/23/2021  ? Anxiety 10/03/2015  ? Last Assessment & Plan:  Formatting of this note might be different from the original. Relevant Hx: Course: Daily Update: Today's Plan:she feels this is stable for her at this time  Electronically signed by: Mayer Camel, NP 01/27/16 2319  ? Asthma   ? B12 deficiency 05/16/2020  ? Bilateral impacted cerumen 08/16/2021  ? Body mass index (BMI) 30.0-30.9, adult 11/23/2021  ? Cataract   ? right removed, left formin   ? Constipation   ? "my whole life" uses colace daily- soft stools   ? Degenerative joint disease 10/03/2015  ? Last Assessment & Plan:  Formatting of this note might be different from the original. Relevant Hx: Course: Daily Update: Today's Plan:she has the joint pain that is separate from her RA and we discussed her having something that is narcotic based if she is having pain that she cannot sleep with and she wants to avoid right now but she indicates that she will let us know if she decides to do this   ? Diverticulosis large intestine w/o perforation or abscess w/o bleeding 12/26/2020  ? Encounter for long-term (current) use of high-risk medication 10/03/2015  ? Essential hypertension 10/03/2015  ? Last Assessment & Plan:  Formatting of this note might be different from the original. Relevant Hx: Course: Daily Update: Today's Plan:this is stable for her   Electronically signed by: Mayer Camel, NP  01/27/16 2310  ? Heart murmur   ? History of stroke 10/03/2021  ? Formatting of this note might be different from the original. Right occipital CVA seen on MRI confirmed with CTA of the head and neck not acute, visual field deficit was the only symptom  ? Hyperlipidemia   ? Hypertension   ? Immunosuppressed status ([place]) 10/19/2019  ? Iron deficiency 05/16/2020  ? Macular degeneration 09/12/2017  ? Mild intermittent asthma without complication 0/04/2329  ? Last Assessment & Plan:  Formatting of this note  might be different from the original. Relevant Hx: Course: Daily Update: Today's Plan:she feels she is doing fair overall from this  Electronically signed by: Mayer Camel, NP 01/27/16 2311  ? Mixed hyperlipidemia 10/03/2015  ? Last Assessment & Plan:  Formatting of this note might be different from the original. Relevant Hx: Course: Daily Update: Today's Plan:update this fasting for her  Electronically signed by: Mayer Camel, NP 01/27/16 2318  ? 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 Osteopenia of multiple sites 09/12/2017  ? Last Assessment & Plan:  Formatting of this note might be different from the original. Rheumatology started her on Fosamax  ? Osteoporosis   ? Other long term (current) drug therapy 11/23/2021  ? Ovarian mass, right 02/07/2021  ? Prediabetes 09/12/2017  ? Primary insomnia 03/12/2018  ? Rheumatoid arthritis ([place])   ? Thyroid disease   ? Tubulovillous adenoma of colon 2016  ? Vitamin D deficiency 10/03/2015  ? Last Assessment & Plan:  Formatting of this note might be different from the original. Relevant Hx: Course: Daily Update: Today's Plan:return for this to be drawn  Electronically signed by: Mayer Camel, NP 01/27/16 2318  ? ? ?Past Surgical History:  ?Procedure Laterality Date  ? CATARACT EXTRACTION Right 2017  ? COLONOSCOPY    ? MOUTH SURGERY    ? impacted teeth  ? POLYPECTOMY    ? ? ?Current Medications: ?Current Meds  ?Medication Sig  ? Abatacept (ORENCIA [place]) Inject 1 each into the skin every 30 (thirty) days. Unknown strenght  ? acetaminophen (TYLENOL) 500 MG tablet Take 1,000 mg by mouth in the morning and at bedtime.  ? albuterol (PROVENTIL HFA;VENTOLIN HFA) 108 (90 Base) MCG/ACT inhaler Inhale 1 puff into the lungs every 6 (six) hours as needed for wheezing or shortness of breath.  ? albuterol (PROVENTIL) (2.5 MG/3ML) 0.083% nebulizer solution Take 2.5 mg by nebulization every 6 (six) hours as needed for wheezing or shortness of breath.   ? alendronate (FOSAMAX) 70 MG tablet TAKE 1 TABLET BY MOUTH ONE TIME PER WEEK  ? ALPRAZolam (XANAX) 0.5 MG tablet Take 0.5 mg by mouth. Take 1/2 tablet at bedtime  ? amLODipine (NORVASC) 10 MG tablet Take 10 mg by mouth daily.  ? aspirin 81 MG tablet Take 81 mg by mouth daily.  ? Calcium Carbonate-Vitamin D 600-200 MG-UNIT TABS Take by mouth.  ? CALCIUM-VITAMIN D PO Take by mouth. Take 2 daily  ? clopidogrel (PLAVIX) 75 MG tablet Take 1 tablet (75 mg total) by mouth daily.  ? co-enzyme Q-10 30 MG capsule Take 30 mg by mouth daily.  ? diclofenac (VOLTAREN) 75 MG EC tablet Take 75 mg by mouth 2 (two) times daily.  ? docusate sodium (COLACE) 100 MG capsule Take 100 mg by mouth daily.  ? folic acid (FOLVITE) 1 MG tablet Take 1 mg by mouth daily.  ? hydroxychloroquine (PLAQUENIL) 200 MG tablet Take by mouth 2 (two) times daily.  ?  levothyroxine (SYNTHROID, LEVOTHROID) 137 MCG tablet Take 137 mcg by mouth daily before breakfast. Every other day alternates with the 150 mcg  ? levothyroxine (SYNTHROID, LEVOTHROID) 150 MCG tablet Take 150 mcg by mouth. Every other day  ? Lutein 20 MG CAPS Take by mouth.  ? meloxicam (MOBIC) 15 MG tablet Take by mouth.  ? montelukast (SINGULAIR) 10 MG tablet Take 10 mg by mouth at bedtime.  ? Multiple Vitamins-Minerals (CENTRUM SILVER PO) Take by mouth daily.  ? Omega 3 1200 MG CAPS Take by mouth. Take 2 a day  ? pantoprazole (PROTONIX) 40 MG tablet Take 20 mg by mouth daily.  ? predniSONE (DELTASONE) 5 MG tablet TK 0.5T PO QD  ? simvastatin (ZOCOR) 20 MG tablet Take 20 mg by mouth daily.  ? triamcinolone cream (KENALOG) 0.1 % triamcinolone acetonide 0.1 % topical cream  ? valsartan (DIOVAN) 160 MG tablet Take 160 mg by mouth daily.  ? [DISCONTINUED] Methotrexate, PF, 25 MG/0.5ML SOAJ Inject into the skin once a week.  ?  ? ?Allergies:   Patient has no known allergies.  ? ?Social History  ? ?Socioeconomic History  ? Marital status: Married  ?  Spouse name: Not on file  ? Number of children:  Not on file  ? Years of education: Not on file  ? Highest education level: Not on file  ?Occupational History  ? Not on file  ?Tobacco Use  ? Smoking status: Former  ? Smokeless tobacco: Never  ? [place]

## 2021-11-24 NOTE — Patient Instructions (Signed)
Medication Instructions:  Your physician recommends that you continue on your current medications as directed. Please refer to the Current Medication list given to you today.  *If you need a refill on your cardiac medications before your next appointment, please call your pharmacy*   Lab Work: None Ordered If you have labs (blood work) drawn today and your tests are completely normal, you will receive your results only by: MyChart Message (if you have MyChart) OR A paper copy in the mail If you have any lab test that is abnormal or we need to change your treatment, we will call you to review the results.   Testing/Procedures: Your physician has requested that you have an echocardiogram. Echocardiography is a painless test that uses sound waves to create images of your heart. It provides your doctor with information about the size and shape of your heart and how well your heart's chambers and valves are working. This procedure takes approximately one hour. There are no restrictions for this procedure.    WHY IS MY DOCTOR PRESCRIBING ZIO? The Zio system is proven and trusted by physicians to detect and diagnose irregular heart rhythms -- and has been prescribed to hundreds of thousands of patients.  The FDA has cleared the Zio system to monitor for many different kinds of irregular heart rhythms. In a study, physicians were able to reach a diagnosis 90% of the time with the Zio system1.  You can wear the Zio monitor -- a small, discreet, comfortable patch -- during your normal day-to-day activity, including while you sleep, shower, and exercise, while it records every single heartbeat for analysis.  1Barrett, P., et al. Comparison of 24 Hour Holter Monitoring Versus 14 Day Novel Adhesive Patch Electrocardiographic Monitoring. American Journal of Medicine, 2014.  ZIO VS. HOLTER MONITORING The Zio monitor can be comfortably worn for up to 14 days. Holter monitors can be worn for 24 to 48  hours, limiting the time to record any irregular heart rhythms you may have. Zio is able to capture data for the 51% of patients who have their first symptom-triggered arrhythmia after 48 hours.1  LIVE WITHOUT RESTRICTIONS The Zio ambulatory cardiac monitor is a small, unobtrusive, and water-resistant patch--you might even forget you're wearing it. The Zio monitor records and stores every beat of your heart, whether you're sleeping, working out, or showering.     Follow-Up: At [facility], you and your health needs are our priority.  As part of our continuing mission to provide you with exceptional heart care, we have created designated Provider Care Teams.  These Care Teams include your primary Cardiologist (physician) and Advanced Practice Providers (APPs -  Physician Assistants and Nurse Practitioners) who all work together to provide you with the care you need, when you need it.  We recommend signing up for the patient portal called "MyChart".  Sign up information is provided on this After Visit Summary.  MyChart is used to connect with patients for Virtual Visits (Telemedicine).  Patients are able to view lab/test results, encounter notes, upcoming appointments, etc.  Non-urgent messages can be sent to your provider as well.   To learn more about what you can do with MyChart, go to [email or web address].    Your next appointment:   2 month(s)  The format for your next appointment:   In Person  Provider:   Robert Krasowski, MD    Other Instructions NA  

## 2021-11-24 NOTE — Addendum Note (Signed)
Addended by: Jacobo Forest D on: 11/24/2021 02:50 PM ? ? Modules accepted: Orders ? ?

## 2021-12-06 ENCOUNTER — Ambulatory Visit (INDEPENDENT_AMBULATORY_CARE_PROVIDER_SITE_OTHER): Payer: Medicare Other

## 2021-12-06 DIAGNOSIS — R011 Cardiac murmur, unspecified: Secondary | ICD-10-CM

## 2021-12-06 LAB — ECHOCARDIOGRAM COMPLETE
Area-P 1/2: 3.39 cm2
S' Lateral: 2 cm

## 2021-12-07 ENCOUNTER — Telehealth: Payer: Self-pay

## 2021-12-07 NOTE — Telephone Encounter (Signed)
Patient notified of results.

## 2021-12-07 NOTE — Telephone Encounter (Signed)
-----   Message from Park Liter, MD sent at 12/06/2021  9:42 PM EDT ----- ?Echocardiogram showed normal left ventricle ejection fraction, overall looks good ?

## 2021-12-13 ENCOUNTER — Telehealth: Payer: Self-pay | Admitting: Cardiology

## 2021-12-13 NOTE — Telephone Encounter (Signed)
Pt requested that her last office note and echo be sent to her PCP as she has an appt 12/14/21. ?

## 2021-12-13 NOTE — Telephone Encounter (Signed)
Pt is calling to check to see if the results from her 5/03 appt have also been sent to her PCP and also the results of her echo.  ?

## 2021-12-14 DIAGNOSIS — D72829 Elevated white blood cell count, unspecified: Secondary | ICD-10-CM | POA: Insufficient documentation

## 2022-01-05 ENCOUNTER — Telehealth: Payer: Self-pay

## 2022-01-05 NOTE — Telephone Encounter (Signed)
Results reviewed with pt as per Dr. Wendy Poet note. Pt stated that she was not symptomatic and was feeling fine at this time.  Pt verbalized understanding and had no additional questions. Routed to PCP

## 2022-01-31 ENCOUNTER — Ambulatory Visit (INDEPENDENT_AMBULATORY_CARE_PROVIDER_SITE_OTHER): Payer: Medicare Other | Admitting: Cardiology

## 2022-01-31 ENCOUNTER — Encounter: Payer: Self-pay | Admitting: Cardiology

## 2022-01-31 VITALS — BP 166/80 | HR 82 | Ht <= 58 in | Wt 127.8 lb

## 2022-01-31 DIAGNOSIS — I6930 Unspecified sequelae of cerebral infarction: Secondary | ICD-10-CM | POA: Diagnosis not present

## 2022-01-31 DIAGNOSIS — I10 Essential (primary) hypertension: Secondary | ICD-10-CM

## 2022-01-31 DIAGNOSIS — E785 Hyperlipidemia, unspecified: Secondary | ICD-10-CM | POA: Diagnosis not present

## 2022-01-31 DIAGNOSIS — R011 Cardiac murmur, unspecified: Secondary | ICD-10-CM | POA: Diagnosis not present

## 2022-01-31 NOTE — Patient Instructions (Signed)
Medication Instructions:  Your physician recommends that you continue on your current medications as directed. Please refer to the Current Medication list given to you today.  *If you need a refill on your cardiac medications before your next appointment, please call your pharmacy*   Lab Work: NONE If you have labs (blood work) drawn today and your tests are completely normal, you will receive your results only by: [place] (if you have MyChart) OR A paper copy in the mail If you have any lab test that is abnormal or we need to change your treatment, we will call you to review the results.   Testing/Procedures: NONE   Follow-Up: At [facility], [place], you and your health needs are our priority.  As part of our continuing mission to provide you with exceptional heart care, we have created designated Provider Care Teams.  These Care Teams include your primary Cardiologist (physician) and Advanced Practice Providers (APPs -  Physician Assistants and Nurse Practitioners) who all work together to provide you with the care you need, when you need it.  We recommend signing up for the patient portal called "MyChart".  Sign up information is provided on this After Visit Summary.  MyChart is used to connect with patients for Virtual Visits (Telemedicine).  Patients are able to view lab/test results, encounter notes, upcoming appointments, etc.  Non-urgent messages can be sent to your provider as well.   To learn more about what you can do with MyChart, go to [email or web address].    Your next appointment:   5 month(s)  The format for your next appointment:   In Person  Provider:   Jenne Campus, MD    Other Instructions   Important Information About Sugar

## 2022-01-31 NOTE — Progress Notes (Unsigned)
Cardiology Office Note:    Date:  01/31/2022   ID:  Haley Pham, DOB Oct 19, 1945, MRN [id number]  PCP:  Mayer Camel, NP  Cardiologist:  Jenne Campus, MD    Referring MD: Bess Harvest*   Chief Complaint  Patient presents with   Results  Doing fine  History of Present Illness:    Haley Pham is a 76 y.o. female sweet lady who was referred to [place] to rule out cardiac source of emboli.  Past medical history significant for essential hypertension, hyperlipidemia, rheumatoid arthritis, hypothyroidism she was referred to [place] after she was find to have visual field defect then MRI was performed which showed right occipital stroke then she was referred to neurology for evaluation for that and she was referred to [place] for cardiac source of emboli evaluation so far we did echocardiogram which showed no significant finding, normal biatrial size, she did wear Zio patch for 2 weeks which shows some supraventricular tachycardia but no atrial fibrillation.  I brought her back to my office today to talk about potentially implantable loop recorder.  I show her device I described the procedure.  I explained the reason for that I told her her CHADS2 Vascor will be 5 if she does have atrial fibrillation and if that is the case she need to be anticoagulated.  She promised me to think it over and talk to her husband and make a decision over the next few days about potentially pursuing implantation of loop recorder.  Past Medical History:  Diagnosis Date   Acquired hypothyroidism 10/03/2015   Last Assessment & Plan:  Formatting of this note might be different from the original. Relevant Hx: Course: Daily Update: Today's Plan:she will return for this to be repeated  Electronically signed by: Mayer Camel, NP 01/27/16 2311   Adult-onset obesity 11/23/2021   Anxiety 10/03/2015   Last Assessment & Plan:  Formatting of this note might be different from the original. Relevant Hx:  Course: Daily Update: Today's Plan:she feels this is stable for her at this time  Electronically signed by: Mayer Camel, NP 01/27/16 2319   Asthma    B12 deficiency 05/16/2020   Bilateral impacted cerumen 08/16/2021   Body mass index (BMI) 30.0-30.9, adult 11/23/2021   Cataract    right removed, left formin    Constipation    "my whole life" uses colace daily- soft stools    Degenerative joint disease 10/03/2015   Last Assessment & Plan:  Formatting of this note might be different from the original. Relevant Hx: Course: Daily Update: Today's Plan:she has the joint pain that is separate from her RA and we discussed her having something that is narcotic based if she is having pain that she cannot sleep with and she wants to avoid right now but she indicates that she will let us know if she decides to do this    Diverticulosis large intestine w/o perforation or abscess w/o bleeding 12/26/2020   Encounter for long-term (current) use of high-risk medication 10/03/2015   Essential hypertension 10/03/2015   Last Assessment & Plan:  Formatting of this note might be different from the original. Relevant Hx: Course: Daily Update: Today's Plan:this is stable for her   Electronically signed by: Mayer Camel, NP 01/27/16 2310   Heart murmur    History of stroke 10/03/2021   Formatting of this note might be different from the original. Right occipital CVA seen on MRI confirmed with CTA of the head and neck  not acute, visual field deficit was the only symptom   Hyperlipidemia    Hypertension    Immunosuppressed status ([place]) 10/19/2019   Iron deficiency 05/16/2020   Macular degeneration 09/12/2017   Mild intermittent asthma without complication 7/62/8315   Last Assessment & Plan:  Formatting of this note might be different from the original. Relevant Hx: Course: Daily Update: Today's Plan:she feels she is doing fair overall from this  Electronically signed by: Mayer Camel,  NP 01/27/16 2311   Mixed hyperlipidemia 10/03/2015   Last Assessment & Plan:  Formatting of this note might be different from the original. 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 Rheumatology started her on Fosamax   Osteoporosis    Other long term (current) drug therapy 11/23/2021   Ovarian mass, right 02/07/2021   Prediabetes 09/12/2017   Primary insomnia 03/12/2018   Rheumatoid arthritis ([place])    Thyroid disease    Tubulovillous adenoma of colon 2016   Vitamin D deficiency 10/03/2015   Last Assessment & Plan:  Formatting of this note might be different from the original. Relevant Hx: Course: Daily Update: Today's Plan:return for this to be drawn  Electronically signed by: Mayer Camel, NP 01/27/16 2318    Past Surgical History:  Procedure Laterality Date   CATARACT EXTRACTION Right 2017   COLONOSCOPY     MOUTH SURGERY     impacted teeth   POLYPECTOMY      Current Medications: Current Meds  Medication Sig   Abatacept (ORENCIA [place]) Inject 1 each into the skin every 30 (thirty) days. Unknown strenght   acetaminophen (TYLENOL) 500 MG tablet Take 1,000 mg by mouth in the morning and at bedtime.   albuterol (PROVENTIL HFA;VENTOLIN HFA) 108 (90 Base) MCG/ACT inhaler Inhale 1 puff into the lungs every 6 (six) hours as needed for wheezing or shortness of breath.   albuterol (PROVENTIL) (2.5 MG/3ML) 0.083% nebulizer solution Take 2.5 mg by nebulization every 6 (six) hours as needed for wheezing or shortness of breath.   alendronate (FOSAMAX) 70 MG tablet Take 70 mg by mouth once a week.   ALPRAZolam (XANAX) 0.5 MG tablet Take 0.5 mg by mouth at bedtime. Take 1/2 tablet at bedtime   amLODipine  (NORVASC) 10 MG tablet Take 10 mg by mouth daily.   Calcium Carbonate-Vitamin D 600-200 MG-UNIT TABS Take 1 tablet by mouth daily.   CALCIUM-VITAMIN D PO Take 1 tablet by mouth daily. Take 2 daily   clopidogrel (PLAVIX) 75 MG tablet Take 1 tablet (75 mg total) by mouth daily.   co-enzyme Q-10 30 MG capsule Take 30 mg by mouth daily.   diclofenac (VOLTAREN) 75 MG EC tablet Take 75 mg by mouth 2 (two) times daily.   docusate sodium (COLACE) 100 MG capsule Take 100 mg by mouth daily.   folic acid (FOLVITE) 1 MG tablet Take 1 mg by mouth daily.   hydroxychloroquine (PLAQUENIL) 200 MG tablet Take 200 mg by mouth 2 (two) times daily.   levothyroxine (SYNTHROID, LEVOTHROID) 137 MCG tablet Take 137 mcg by mouth daily before breakfast. Every other day alternates with the 150 mcg   levothyroxine (SYNTHROID, LEVOTHROID) 150 MCG tablet Take 150 mcg by mouth daily before breakfast. Every other  day   Lutein 20 MG CAPS Take 1 capsule by mouth daily.   montelukast (SINGULAIR) 10 MG tablet Take 10 mg by mouth at bedtime.   Multiple Vitamins-Minerals (CENTRUM SILVER PO) Take 1 tablet by mouth daily.   pantoprazole (PROTONIX) 40 MG tablet Take 20 mg by mouth daily.   predniSONE (DELTASONE) 5 MG tablet Take 2.5 mg by mouth daily with breakfast.   simvastatin (ZOCOR) 20 MG tablet Take 20 mg by mouth daily.   triamcinolone cream (KENALOG) 0.1 % Apply 1 Application topically as needed (rash).   valsartan (DIOVAN) 160 MG tablet Take 160 mg by mouth daily.   [DISCONTINUED] aspirin 81 MG tablet Take 81 mg by mouth daily.   [DISCONTINUED] meloxicam (MOBIC) 15 MG tablet Take 15 mg by mouth daily.   [DISCONTINUED] Omega 3 1200 MG CAPS Take 1 capsule by mouth daily. Take 2 a day   [DISCONTINUED] predniSONE (DELTASONE) 5 MG tablet Take 5 mg by mouth.     Allergies:   Patient has no known allergies.   Social History   Socioeconomic History   Marital status: Married    Spouse name: Not on file   Number of children:  Not on file   Years of education: Not on file   Highest education level: Not on file  Occupational History   Not on file  Tobacco Use   Smoking status: Former   Smokeless tobacco: Never   Tobacco comments:    smoke for about 6 yrs in her 50"s  Substance and Sexual Activity   Alcohol use: No    Alcohol/week: 0.0 standard drinks of alcohol   Drug use: No   Sexual activity: Not on file  Other Topics Concern   Not on file  Social History Narrative   Not on file   Social Determinants of Health   Financial Resource Strain: Not on file  Food Insecurity: Not on file  Transportation Needs: Not on file  Physical Activity: Not on file  Stress: Not on file  Social Connections: Not on file     Family History: The patient's family history includes Asthma in her mother; Heart disease in her father; Hypertension in her mother; Pancreatic cancer in her mother. There is no history of Colon cancer, Esophageal cancer, Rectal cancer, Stomach cancer, or Colon polyps. ROS:   Please see the history of present illness.    All 14 point review of systems negative except as described per history of present illness  EKGs/Labs/Other Studies Reviewed:      Recent Labs: 09/07/2021: ALT 22; BUN 13; Creatinine, Ser 0.72; Potassium 4.5; Sodium 138  Recent Lipid Panel    Component Value Date/Time   CHOL 161 09/07/2021 1342   TRIG 112 09/07/2021 1342   HDL 84 09/07/2021 1342   CHOLHDL 1.9 09/07/2021 1342   LDLCALC 58 09/07/2021 1342    Physical Exam:    VS:  BP (!) 166/80 (BP Location: Left Arm, Patient Position: Sitting)   Pulse 82   Ht '4\' 9"'$  (1.448 m)   Wt 127 lb 12.8 oz (58 kg)   SpO2 96%   BMI 27.66 kg/m     Wt Readings from Last 3 Encounters:  01/31/22 127 lb 12.8 oz (58 kg)  11/24/21 126 lb 9.6 oz (57.4 kg)  09/07/21 127 lb 8 oz (57.8 kg)     GEN:  Well nourished, well developed in no acute distress HEENT: Normal NECK: No JVD; No carotid bruits LYMPHATICS: No  lymphadenopathy CARDIAC: RRR, no  murmurs, no rubs, no gallops RESPIRATORY:  Clear to auscultation without rales, wheezing or rhonchi  ABDOMEN: Soft, non-tender, non-distended MUSCULOSKELETAL:  No edema; No deformity  SKIN: Warm and dry LOWER EXTREMITIES: no swelling NEUROLOGIC:  Alert and oriented x 3 PSYCHIATRIC:  Normal affect   ASSESSMENT:    1. Late effect of cerebrovascular accident (CVA)   2. Essential hypertension   3. Heart murmur   4. Dyslipidemia    PLAN:    In order of problems listed above:  Late effect of CVA no new problems, she is on antiplatelet therapy as well as statin.  Continue present management discussion about implantable loop recorder as described above Essential hypertension blood pressure slightly on the higher side but she says she was rushing to today.  We will continue present management Heart murmur her echocardiogram did not show significant abnormalities.  We will continue monitoring Dyslipidemia I did review her K PN which show me her LDL 58 HDL 84 this is from February this year we will continue present management.  She will think about potentially implantable loop recorder let us know if she were to pursue it.   Medication Adjustments/Labs and Tests Ordered: Current medicines are reviewed at length with the patient today.  Concerns regarding medicines are outlined above.  No orders of the defined types were placed in this encounter.  Medication changes: No orders of the defined types were placed in this encounter.   Signed, Park Liter, MD, [facility] 01/31/2022 3:09 PM    [place]

## 2022-02-05 ENCOUNTER — Telehealth: Payer: Self-pay | Admitting: Cardiology

## 2022-02-05 DIAGNOSIS — R002 Palpitations: Secondary | ICD-10-CM

## 2022-02-05 DIAGNOSIS — R011 Cardiac murmur, unspecified: Secondary | ICD-10-CM

## 2022-02-05 NOTE — Telephone Encounter (Signed)
Pt has decided to make an appt with Dr. Curt Bears for the Loop recorder. Will send referral per Dr. Wendy Poet note.

## 2022-02-05 NOTE — Telephone Encounter (Signed)
Patient was calling in bout the loop recorder. Please advise

## 2022-03-21 ENCOUNTER — Encounter: Payer: Self-pay | Admitting: Cardiology

## 2022-03-21 ENCOUNTER — Ambulatory Visit (INDEPENDENT_AMBULATORY_CARE_PROVIDER_SITE_OTHER): Payer: Medicare Other | Admitting: Cardiology

## 2022-03-21 VITALS — BP 132/78 | HR 82 | Ht <= 58 in | Wt 124.8 lb

## 2022-03-21 DIAGNOSIS — I639 Cerebral infarction, unspecified: Secondary | ICD-10-CM

## 2022-03-21 NOTE — Progress Notes (Signed)
Electrophysiology Office Note   Date:  03/21/2022   ID:  Haley Pham, DOB 1945/12/02, MRN [id number]  PCP:  Mayer Camel, NP  Cardiologist:  Agustin Cree Primary Electrophysiologist:  Johnluke Haugen Meredith Leeds, MD    Chief Complaint: CVA   History of Present Illness: Haley Pham is a 76 y.o. female who is being seen today for the evaluation of CVA at the request of Park Liter, MD. Presenting today for electrophysiology evaluation.  Haley Pham presented to cardiology clinic to rule out embolic source from cryptogenic stroke.  Haley Pham has a history of hypertension, hyperlipidemia, rheumatoid arthritis, hypothyroidism.  Haley Pham was found to have visual field defects by her optometrist and was referred for MRI.  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  Haley Pham had an echo and a Zio patch that showed no major abnormalities.  Haley Pham presents today for possible Linq monitor implant.  Today, Haley Pham denies symptoms of palpitations, chest pain, shortness of breath, orthopnea, PND, lower extremity edema, claudication, dizziness, presyncope, syncope, bleeding, or neurologic sequela. The patient is tolerating medications without difficulties.    Past Medical History:  Diagnosis Date   Acquired hypothyroidism 10/03/2015   Last Assessment & Plan:  Formatting of this note might be different from the original. Relevant Hx: Course: Daily Update: Today's Plan:Haley Pham Atreyu Mak return for this to be repeated  Electronically signed by: Mayer Camel, NP 01/27/16 2311   Adult-onset obesity 11/23/2021   Anxiety 10/03/2015   Last Assessment & Plan:  Formatting of this note might be different from the original. Relevant Hx: Course: Daily Update: Today's Plan:Haley Pham feels this is stable for her at this time  Electronically signed by: Mayer Camel, NP 01/27/16 2319   Asthma    B12 deficiency 05/16/2020   Bilateral impacted cerumen 08/16/2021   Body mass index (BMI) 30.0-30.9, adult 11/23/2021   Cataract     right removed, left formin    Constipation    "my whole life" uses colace daily- soft stools    Degenerative joint disease 10/03/2015   Last Assessment & Plan:  Formatting of this note might be different from the original. Relevant Hx: Course: Daily Update: Today's Plan:Haley Pham has the joint pain that is separate from her RA and we discussed her having something that is narcotic based if Haley Pham is having pain that Haley Pham cannot sleep with and Haley Pham wants to avoid right now but Haley Pham indicates that Haley Pham Adonai Helzer let us know if Haley Pham decides to do this    Diverticulosis large intestine w/o perforation or abscess w/o bleeding 12/26/2020   Encounter for long-term (current) use of high-risk medication 10/03/2015   Essential hypertension 10/03/2015   Last Assessment & Plan:  Formatting of this note might be different from the original. Relevant Hx: Course: Daily Update: Today's Plan:this is stable for her   Electronically signed by: Mayer Camel, NP 01/27/16 2310   Heart murmur    History of stroke 10/03/2021   Formatting of this note might be different from the original. Right occipital CVA seen on MRI confirmed with CTA of the head and neck not acute, visual field deficit was the only symptom   Hyperlipidemia    Hypertension    Immunosuppressed status ([place]) 10/19/2019   Iron deficiency 05/16/2020   Macular degeneration 09/12/2017   Mild intermittent asthma without complication 9/47/0962   Last Assessment & Plan:  Formatting of this note might be different from the original. Relevant Hx: Course: Daily Update: Today's Plan:Haley Pham feels Haley Pham is doing fair overall from this  Electronically signed by: Mayer Camel, NP 01/27/16 2311   Mixed hyperlipidemia 10/03/2015   Last Assessment & Plan:  Formatting of this note might be different from the original. 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 Rheumatology started her on Fosamax   Osteoporosis    Other long term (current) drug therapy 11/23/2021   Ovarian mass, right 02/07/2021   Prediabetes 09/12/2017   Primary insomnia 03/12/2018   Rheumatoid arthritis ([place])    Thyroid disease    Tubulovillous adenoma of colon 2016   Vitamin D deficiency 10/03/2015   Last Assessment & Plan:  Formatting of this note might be different from the original. Relevant Hx: Course: Daily Update: Today's Plan:return for this to be drawn  Electronically signed by: Mayer Camel, NP 01/27/16 2318   Past Surgical History:  Procedure Laterality Date   CATARACT EXTRACTION Right 2017   COLONOSCOPY     MOUTH SURGERY     impacted teeth   POLYPECTOMY       Current Outpatient Medications  Medication Sig Dispense Refill   Abatacept (ORENCIA [place]) Inject 1 each into the skin every 30 (thirty) days. Unknown strenght     acetaminophen (TYLENOL) 500 MG tablet Take 1,000 mg by mouth in the morning and at bedtime.     albuterol (PROVENTIL HFA;VENTOLIN HFA) 108 (90 Base) MCG/ACT inhaler Inhale 1 puff into the lungs every 6 (six) hours as needed for wheezing or shortness of breath.     albuterol (PROVENTIL) (2.5 MG/3ML) 0.083% nebulizer solution Take 2.5 mg by nebulization every 6 (six) hours as needed for wheezing or shortness of breath.     alendronate (FOSAMAX) 70 MG tablet Take 70 mg by mouth once a week.     ALPRAZolam (XANAX) 0.5 MG tablet Take 0.5 mg by mouth at bedtime. Take 1/2 tablet at bedtime     amLODipine (NORVASC) 10 MG tablet Take 10 mg by mouth daily.     Calcium Carbonate-Vitamin D 600-200 MG-UNIT TABS Take 1 tablet by mouth daily.     CALCIUM-VITAMIN D PO Take 1 tablet by mouth daily. Take 2 daily     clopidogrel (PLAVIX) 75 MG tablet Take 1 tablet (75 mg total) by  mouth daily. 30 tablet 11   co-enzyme Q-10 30 MG capsule Take 30 mg by mouth daily.     diclofenac (VOLTAREN) 75 MG EC tablet Take 75 mg by mouth 2 (two) times daily.     docusate sodium (COLACE) 100 MG capsule Take 100 mg by mouth daily.     Ferrous Sulfate (IRON) 325 (65 Fe) MG TABS Take 1 tablet by mouth daily in the afternoon.     folic acid (FOLVITE) 1 MG tablet Take 1 mg by mouth daily.     hydroxychloroquine (PLAQUENIL) 200 MG tablet Take 200 mg by mouth 2 (two) times daily.     levothyroxine (SYNTHROID, LEVOTHROID) 137 MCG tablet Take 137 mcg by mouth daily before breakfast. Every other day alternates with the 150 mcg     levothyroxine (SYNTHROID, LEVOTHROID) 150 MCG tablet Take 150 mcg by mouth daily before breakfast. Every other day     Lutein 20 MG CAPS Take 1 capsule by mouth daily.  methotrexate 50 MG/2ML injection Inject 25 mg into the muscle once a week.     montelukast (SINGULAIR) 10 MG tablet Take 10 mg by mouth at bedtime.     Multiple Vitamins-Minerals (CENTRUM SILVER PO) Take 1 tablet by mouth daily.     pantoprazole (PROTONIX) 20 MG tablet Take 20 mg by mouth daily.     pantoprazole (PROTONIX) 40 MG tablet Take 20 mg by mouth daily.     predniSONE (DELTASONE) 5 MG tablet Take 2.5 mg by mouth daily with breakfast.     simvastatin (ZOCOR) 20 MG tablet Take 20 mg by mouth daily.     triamcinolone cream (KENALOG) 0.1 % Apply 1 Application topically as needed (rash).     valsartan (DIOVAN) 160 MG tablet Take 160 mg by mouth daily.     No current facility-administered medications for this visit.    Allergies:   Patient has no known allergies.   Social History:  The patient  reports that Haley Pham has quit smoking. Haley Pham has never used smokeless tobacco. Haley Pham reports that Haley Pham does not drink alcohol and does not use drugs.   Family History:  The patient's family history includes Asthma in her mother; Heart disease in her father; Hypertension in her mother; Pancreatic cancer in her  mother.    ROS:  Please see the history of present illness.   Otherwise, review of systems is positive for none.   All other systems are reviewed and negative.    PHYSICAL EXAM: VS:  BP 132/78   Pulse 82   Ht '4\' 8"'$  (1.422 m)   Wt 124 lb 12.8 oz (56.6 kg)   SpO2 93%   BMI 27.98 kg/m  , BMI Body mass index is 27.98 kg/m. GEN: Well nourished, well developed, in no acute distress  HEENT: normal  Neck: no JVD, carotid bruits, or masses Cardiac: RRR; 2/6 systolic murmur at the base Respiratory:  clear to auscultation bilaterally, normal work of breathing GI: soft, nontender, nondistended, + BS MS: no deformity or atrophy  Skin: warm and dry Neuro:  Strength and sensation are intact Psych: euthymic mood, full affect  EKG:  EKG is not ordered today. Personal review of the ekg ordered 11/24/21 shows sinus rhythm   Recent Labs: 09/07/2021: ALT 22; BUN 13; Creatinine, Ser 0.72; Potassium 4.5; Sodium 138    Lipid Panel     Component Value Date/Time   CHOL 161 09/07/2021 1342   TRIG 112 09/07/2021 1342   HDL 84 09/07/2021 1342   CHOLHDL 1.9 09/07/2021 1342   LDLCALC 58 09/07/2021 1342     Wt Readings from Last 3 Encounters:  03/21/22 124 lb 12.8 oz (56.6 kg)  01/31/22 127 lb 12.8 oz (58 kg)  11/24/21 126 lb 9.6 oz (57.4 kg)      Other studies Reviewed: Additional studies/ records that were reviewed today include: TTE 12/06/21  Review of the above records today demonstrates:   1. Left ventricular ejection fraction, by estimation, is 60 to 65%. The  left ventricle has normal function. The left ventricle has no regional  wall motion abnormalities. There is mild left ventricular hypertrophy.  Left ventricular diastolic parameters  are consistent with Grade I diastolic dysfunction (impaired relaxation).   2. Right ventricular systolic function is normal. The right ventricular  size is normal. There is normal pulmonary artery systolic pressure.   3. The mitral valve is normal in  structure. No evidence of mitral valve  regurgitation. No evidence of mitral stenosis.  4. The aortic valve is normal in structure. Aortic valve regurgitation is  not visualized. Aortic valve sclerosis/calcification is present, without  any evidence of aortic stenosis.   5. The inferior vena cava is normal in size with greater than 50%  respiratory variability, suggesting right atrial pressure of 3 mmHg.    ASSESSMENT AND PLAN:  1.  Cryptogenic stroke: To this point, no cause for her stroke has been discovered.  Haley Pham has had CT angio head and neck without obvious vascular disease and an echo without issue.  Due to that, we Drako Maese plan for Linq monitor implant to look for atrial fibrillation.  Risks and benefits were discussed which include bleeding and infection.  Haley Pham understands these risks and is agreed to the procedure.    Current medicines are reviewed at length with the patient today.   The patient does not have concerns regarding her medicines.  The following changes were made today:  none  Labs/ tests ordered today include:  No orders of the defined types were placed in this encounter.    Disposition:   FU with Lorelie Biermann pending link monitor results  Signed, Ahana Najera Meredith Leeds, MD  03/21/2022 4:25 PM     [address] [place] [address] [phone] (office) [phone] (fax)  SURGEON:  Allegra Lai, MD     PREPROCEDURE DIAGNOSIS:  Cryptogenic Stroke    POSTPROCEDURE DIAGNOSIS:  Cryptogenic Stroke     PROCEDURES:   1. Implantable loop recorder implantation    INTRODUCTION:  Sandhya Denherder is a 76 y.o. female with a history of unexplained stroke who presents today for implantable loop implantation.  The patient has had a cryptogenic stroke.  Despite an extensive workup by neurology, no reversible causes have been identified.  Haley Pham has worn telemetry during which Haley Pham did not have arrhythmias.  There is significant concern for  possible atrial fibrillation as the cause for the patients stroke.  The patient therefore presents today for implantable loop implantation.     DESCRIPTION OF PROCEDURE:  Informed written consent was obtained, and the patient was brought to the electrophysiology lab in a fasting state.  The patient required no sedation for the procedure today.  Mapping over the patient's chest was performed by the EP lab staff to identify the area where electrograms were most prominent for ILR recording.  This area was found to be the left parasternal region over the 3rd-4th intercostal space. The patients left chest was therefore prepped and draped in the usual sterile fashion by the EP lab staff. The skin overlying the left parasternal region was infiltrated with lidocaine for local analgesia.  A 0.5-cm incision was made over the left parasternal region over the 3rd intercostal space.  A subcutaneous ILR pocket was fashioned using a combination of sharp and blunt dissection.  A Medtronic Reveal Linq model [place] SN [id number] implantable loop recorder was then placed into the pocket  R waves were very prominent and measured 0.49m. EBL<1 ml.  Steri- Strips and a sterile dressing were then applied.  There were no early apparent complications.     CONCLUSIONS:   1. Successful implantation of a Medtronic Reveal LINQ implantable loop recorder for cryptogenic stroke  2. No early apparent complications.

## 2022-03-21 NOTE — Patient Instructions (Signed)
Medication Instructions:  Your physician recommends that you continue on your current medications as directed. Please refer to the Current Medication list given to you today.  Labwork: None ordered.  Testing/Procedures: None ordered.  Follow-Up:  Your physician wants you to follow-up as needed.  You will receive a reminder letter in the mail two months in advance. If you don't receive a letter, please call our office to schedule the follow-up appointment.    Device clinic [phone]  Implantable Loop Recorder Placement, Care After This sheet gives you information about how to care for yourself after your procedure. Your health care provider may also give you more specific instructions. If you have problems or questions, contact your health care provider. What can I expect after the procedure? After the procedure, it is common to have: Soreness or discomfort near the incision. Some swelling or bruising near the incision.  Follow these instructions at home: Incision care  Monitor your cardiac device site for redness, swelling, and drainage. Call the device clinic at [phone] if you experience these symptoms or fever/chills.  Keep the large square bandage on your site for 24 hours and then you may remove it yourself. Keep the steri-strips underneath in place.   You may shower after 72 hours / 3 days from your procedure with the steri-strips in place. They will usually fall off on their own, or may be removed after 10 days. Pat dry.   Avoid lotions, ointments, or perfumes over your incision until it is well-healed.  Please do not submerge in water until your site is completely healed.   Your device is MRI compatible.   Remote monitoring is used to monitor your cardiac device from home. This monitoring is scheduled every month by our office. It allows [place] to keep an eye on the function of your device to ensure it is working properly.  If your wound site starts to bleed apply  pressure.    For help with the monitor please call Medtronic Monitor Support Specialist directly at [phone].    If you have any questions/concerns please call the device clinic at [phone].  Activity  Return to your normal activities.  General instructions Follow instructions from your health care provider about how to manage your implantable loop recorder and transmit the information. Learn how to activate a recording if this is necessary for your type of device. You may go through a metal detection gate, and you may let someone hold a metal detector over your chest. Show your ID card if needed. Do not have an MRI unless you check with your health care provider first. Take over-the-counter and prescription medicines only as told by your health care provider. Keep all follow-up visits as told by your health care provider. This is important. Contact a health care provider if: You have redness, swelling, or pain around your incision. You have a fever. You have pain that is not relieved by your pain medicine. You have triggered your device because of fainting (syncope) or because of a heartbeat that feels like it is racing, slow, fluttering, or skipping (palpitations). Get help right away if you have: Chest pain. Difficulty breathing. Summary After the procedure, it is common to have soreness or discomfort near the incision. Change your dressing as told by your health care provider. Follow instructions from your health care provider about how to manage your implantable loop recorder and transmit the information. Keep all follow-up visits as told by your health care provider. This is important. This information  is not intended to replace advice given to you by your health care provider. Make sure you discuss any questions you have with your health care provider. Document Released: 07/04/2015 Document Revised: 09/07/2017 Document Reviewed: 09/07/2017 Elsevier Patient Education   2020 [employer].

## 2022-04-23 ENCOUNTER — Ambulatory Visit (INDEPENDENT_AMBULATORY_CARE_PROVIDER_SITE_OTHER): Payer: Medicare Other

## 2022-04-23 DIAGNOSIS — I639 Cerebral infarction, unspecified: Secondary | ICD-10-CM | POA: Diagnosis not present

## 2022-04-24 LAB — CUP PACEART REMOTE DEVICE CHECK
Date Time Interrogation Session: 20230918182738
Implantable Pulse Generator Implant Date: 20230816

## 2022-05-01 ENCOUNTER — Telehealth: Payer: Self-pay | Admitting: Cardiology

## 2022-05-01 NOTE — Telephone Encounter (Signed)
Pt would like a callback regarding Loop Monitor. Please advise

## 2022-05-01 NOTE — Telephone Encounter (Signed)
Returned call to patient who states that she has a mammogram that's due at the end of October and wants to know if she can still do this with her Loop recorder in place. Called device team and spoke with Kem who states that this is completely safe for patient to do. Advised patient and she appreciates follow-through.

## 2022-05-08 NOTE — Progress Notes (Signed)
[facility] Summary Report / Loop Recorder 

## 2022-05-28 ENCOUNTER — Ambulatory Visit: Payer: Medicare Other | Attending: Cardiology

## 2022-05-28 DIAGNOSIS — I639 Cerebral infarction, unspecified: Secondary | ICD-10-CM | POA: Diagnosis not present

## 2022-05-29 LAB — CUP PACEART REMOTE DEVICE CHECK
Date Time Interrogation Session: 20231022230643
Implantable Pulse Generator Implant Date: 20230816

## 2022-06-25 NOTE — Progress Notes (Signed)
[facility] Summary Report / Loop Recorder 

## 2022-07-02 ENCOUNTER — Ambulatory Visit (INDEPENDENT_AMBULATORY_CARE_PROVIDER_SITE_OTHER): Payer: Medicare Other

## 2022-07-02 DIAGNOSIS — I639 Cerebral infarction, unspecified: Secondary | ICD-10-CM | POA: Diagnosis not present

## 2022-07-03 ENCOUNTER — Ambulatory Visit: Payer: Medicare Other | Attending: Cardiology | Admitting: Cardiology

## 2022-07-03 ENCOUNTER — Encounter: Payer: Self-pay | Admitting: Cardiology

## 2022-07-03 VITALS — BP 140/72 | HR 76 | Ht <= 58 in | Wt 122.2 lb

## 2022-07-03 DIAGNOSIS — E039 Hypothyroidism, unspecified: Secondary | ICD-10-CM | POA: Diagnosis present

## 2022-07-03 DIAGNOSIS — Z95818 Presence of other cardiac implants and grafts: Secondary | ICD-10-CM | POA: Diagnosis not present

## 2022-07-03 DIAGNOSIS — I10 Essential (primary) hypertension: Secondary | ICD-10-CM | POA: Insufficient documentation

## 2022-07-03 DIAGNOSIS — I6930 Unspecified sequelae of cerebral infarction: Secondary | ICD-10-CM | POA: Insufficient documentation

## 2022-07-03 LAB — CUP PACEART REMOTE DEVICE CHECK
Date Time Interrogation Session: 20231126230613
Implantable Pulse Generator Implant Date: 20230816

## 2022-07-03 NOTE — Patient Instructions (Signed)

## 2022-07-03 NOTE — Progress Notes (Signed)
Cardiology Office Note:    Date:  07/03/2022   ID:  Haley Pham, DOB 1946-07-24, MRN [id number]  PCP:  Haley Camel, NP  Cardiologist:  Haley Campus, MD    Referring MD: Haley Pham*   Chief Complaint  Patient presents with   Follow-up    Dizzy spell. Amlodipine adjusted by PCP(0.5 tab am and pm    History of Present Illness:    Haley Pham is a 76 y.o. female   sweet lady who was referred to [place] to rule out cardiac source of emboli.  Past medical history significant for essential hypertension, hyperlipidemia, rheumatoid arthritis, hypothyroidism she was referred to [place] after she was find to have visual field defect then MRI was performed which showed right occipital stroke then she was referred to neurology for evaluation for that and she was referred to [place] for cardiac source of emboli evaluation so far we did echocardiogram which showed no significant finding, normal biatrial size, she did wear Zio patch for 2 weeks which shows some supraventricular tachycardia but no atrial fibrillation.  Eventually she ended up having implantable loop recorder placed so far no atrial fibrillation identified.  Comes to my office today.  She fell down few days ago she apparently was trying to sit on the chair missed and fell down striking her head on the refrigerator.  End up going to the emergency room spent couple hours over there likely CT of her head did not show any bleed.  She is doing well since that time.  She also complained to have some dizziness in the morning when she gets up.  Her primary care physician very appropriately switch her medications for hypertension.  Now she takes amlodipine half a tablet in the morning half a tablet of the meantime since that time no more issues.  Past Medical History:  Diagnosis Date   Acquired hypothyroidism 10/03/2015   Last Assessment & Plan:  Formatting of this note might be different from the original. Relevant Hx: Course:  Daily Update: Today's Plan:she will return for this to be repeated  Electronically signed by: Haley Camel, NP 01/27/16 2311   Adult-onset obesity 11/23/2021   Anxiety 10/03/2015   Last Assessment & Plan:  Formatting of this note might be different from the original. Relevant Hx: Course: Daily Update: Today's Plan:she feels this is stable for her at this time  Electronically signed by: Haley Camel, NP 01/27/16 2319   Asthma    B12 deficiency 05/16/2020   Bilateral impacted cerumen 08/16/2021   Body mass index (BMI) 30.0-30.9, adult 11/23/2021   Cataract    right removed, left formin    Constipation    "my whole life" uses colace daily- soft stools    Degenerative joint disease 10/03/2015   Last Assessment & Plan:  Formatting of this note might be different from the original. Relevant Hx: Course: Daily Update: Today's Plan:she has the joint pain that is separate from her RA and we discussed her having something that is narcotic based if she is having pain that she cannot sleep with and she wants to avoid right now but she indicates that she will let us know if she decides to do this    Diverticulosis large intestine w/o perforation or abscess w/o bleeding 12/26/2020   Encounter for long-term (current) use of high-risk medication 10/03/2015   Essential hypertension 10/03/2015   Last Assessment & Plan:  Formatting of this note might be different from the original. Relevant Hx: Course:  Daily Update: Today's Plan:this is stable for her   Electronically signed by: Haley Camel, NP 01/27/16 2310   Heart murmur    History of stroke 10/03/2021   Formatting of this note might be different from the original. Right occipital CVA seen on MRI confirmed with CTA of the head and neck not acute, visual field deficit was the only symptom   Hyperlipidemia    Hypertension    Immunosuppressed status ([place]) 10/19/2019   Iron deficiency 05/16/2020   Macular degeneration 09/12/2017    Mild intermittent asthma without complication 6/60/6301   Last Assessment & Plan:  Formatting of this note might be different from the original. Relevant Hx: Course: Daily Update: Today's Plan:she feels she is doing fair overall from this  Electronically signed by: Haley Camel, NP 01/27/16 2311   Mixed hyperlipidemia 10/03/2015   Last Assessment & Plan:  Formatting of this note might be different from the original. 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 Rheumatology started her on Fosamax   Osteoporosis    Other long term (current) drug therapy 11/23/2021   Ovarian mass, right 02/07/2021   Prediabetes 09/12/2017   Primary insomnia 03/12/2018   Rheumatoid arthritis ([place])    Thyroid disease    Tubulovillous adenoma of colon 2016   Vitamin D deficiency 10/03/2015   Last Assessment & Plan:  Formatting of this note might be different from the original. Relevant Hx: Course: Daily Update: Today's Plan:return for this to be drawn  Electronically signed by: Haley Camel, NP 01/27/16 2318    Past Surgical History:  Procedure Laterality Date   CATARACT EXTRACTION Right 2017   COLONOSCOPY     MOUTH SURGERY     impacted teeth   POLYPECTOMY      Current Medications: Current Meds  Medication Sig   Abatacept (ORENCIA [place]) Inject 1 each into the skin every 30 (thirty) days. Unknown strenght   acetaminophen (TYLENOL) 500 MG tablet Take 1,000 mg by mouth in the morning and at bedtime.   albuterol (PROVENTIL HFA;VENTOLIN HFA) 108 (90 Base) MCG/ACT inhaler Inhale 1 puff into the lungs every 6 (six) hours as needed for wheezing or shortness of breath.   albuterol (PROVENTIL) (2.5 MG/3ML) 0.083%  nebulizer solution Take 2.5 mg by nebulization every 6 (six) hours as needed for wheezing or shortness of breath.   alendronate (FOSAMAX) 70 MG tablet Take 70 mg by mouth once a week.   ALPRAZolam (XANAX) 0.5 MG tablet Take 0.5 mg by mouth at bedtime. Take 1/2 tablet at bedtime   amLODipine (NORVASC) 10 MG tablet Take 5 mg by mouth 2 (two) times daily.   Calcium Carbonate-Vitamin D 600-200 MG-UNIT TABS Take 1 tablet by mouth daily.   CALCIUM-VITAMIN D PO Take 1 tablet by mouth daily. Take 2 daily   clopidogrel (PLAVIX) 75 MG tablet Take 1 tablet (75 mg total) by mouth daily.   co-enzyme Q-10 30 MG capsule Take 30 mg by mouth daily.   diclofenac (VOLTAREN) 75 MG EC tablet Take 75 mg by mouth 2 (two) times daily.   docusate sodium (COLACE) 100 MG capsule Take 100 mg by mouth daily.   Ferrous Sulfate (IRON) 325 (65 Fe) MG TABS Take 1 tablet  by mouth daily in the afternoon.   folic acid (FOLVITE) 1 MG tablet Take 1 mg by mouth daily.   hydroxychloroquine (PLAQUENIL) 200 MG tablet Take 200 mg by mouth 2 (two) times daily.   levothyroxine (SYNTHROID, LEVOTHROID) 137 MCG tablet Take 137 mcg by mouth daily before breakfast. Every other day alternates with the 150 mcg   levothyroxine (SYNTHROID, LEVOTHROID) 150 MCG tablet Take 150 mcg by mouth daily before breakfast. Every other day   Lutein 20 MG CAPS Take 1 capsule by mouth daily.   methotrexate 50 MG/2ML injection Inject 25 mg into the muscle once a week.   montelukast (SINGULAIR) 10 MG tablet Take 10 mg by mouth at bedtime.   Multiple Vitamins-Minerals (CENTRUM SILVER PO) Take 1 tablet by mouth daily.   pantoprazole (PROTONIX) 20 MG tablet Take 20 mg by mouth daily.   predniSONE (DELTASONE) 5 MG tablet Take 2.5 mg by mouth daily with breakfast.   simvastatin (ZOCOR) 20 MG tablet Take 20 mg by mouth daily.   triamcinolone cream (KENALOG) 0.1 % Apply 1 Application topically as needed (rash).   valsartan (DIOVAN) 160 MG tablet Take 160 mg by mouth  daily.   [DISCONTINUED] pantoprazole (PROTONIX) 40 MG tablet Take 20 mg by mouth daily.     Allergies:   Patient has no known allergies.   Social History   Socioeconomic History   Marital status: Married    Spouse name: Not on file   Number of children: Not on file   Years of education: Not on file   Highest education level: Not on file  Occupational History   Not on file  Tobacco Use   Smoking status: Former   Smokeless tobacco: Never   Tobacco comments:    smoke for about 6 yrs in her 60"s  Substance and Sexual Activity   Alcohol use: No    Alcohol/week: 0.0 standard drinks of alcohol   Drug use: No   Sexual activity: Not on file  Other Topics Concern   Not on file  Social History Narrative   Not on file   Social Determinants of Health   Financial Resource Strain: Not on file  Food Insecurity: Not on file  Transportation Needs: Not on file  Physical Activity: Not on file  Stress: Not on file  Social Connections: Not on file     Family History: The patient's family history includes Asthma in her mother; Heart disease in her father; Hypertension in her mother; Pancreatic cancer in her mother. There is no history of Colon cancer, Esophageal cancer, Rectal cancer, Stomach cancer, or Colon polyps. ROS:   Please see the history of present illness.    All 14 point review of systems negative except as described per history of present illness  EKGs/Labs/Other Studies Reviewed:      Recent Labs: 09/07/2021: ALT 22; BUN 13; Creatinine, Ser 0.72; Potassium 4.5; Sodium 138  Recent Lipid Panel    Component Value Date/Time   CHOL 161 09/07/2021 1342   TRIG 112 09/07/2021 1342   HDL 84 09/07/2021 1342   CHOLHDL 1.9 09/07/2021 1342   LDLCALC 58 09/07/2021 1342    Physical Exam:    VS:  BP (!) 140/72 (BP Location: Left Arm, Patient Position: Sitting)   Pulse 76   Ht '4\' 9"'$  (1.448 m)   Wt 122 lb 3.2 oz (55.4 kg)   SpO2 96%   BMI 26.44 kg/m     Wt Readings from  Last 3 Encounters:  07/03/22 122  lb 3.2 oz (55.4 kg)  03/21/22 124 lb 12.8 oz (56.6 kg)  01/31/22 127 lb 12.8 oz (58 kg)     GEN:  Well nourished, well developed in no acute distress HEENT: Normal NECK: No JVD; No carotid bruits LYMPHATICS: No lymphadenopathy CARDIAC: RRR, systolic murmur grade 1/6 to 2/6 best heard right upper portion of the sternum, no rubs, no gallops RESPIRATORY:  Clear to auscultation without rales, wheezing or rhonchi  ABDOMEN: Soft, non-tender, non-distended MUSCULOSKELETAL:  No edema; No deformity  SKIN: Warm and dry LOWER EXTREMITIES: no swelling NEUROLOGIC:  Alert and oriented x 3 PSYCHIATRIC:  Normal affect   ASSESSMENT:    1. Late effect of cerebrovascular accident (CVA)   2. Essential hypertension   3. Acquired hypothyroidism   4. Implantable loop recorder present    PLAN:    In order of problems listed above:  Late effects of CVA.  She does have implantable loop recorder, I did review interrogation of the device so far no atrial fibrillation identified.  Continue watching.  In the meantime continue antiplatelets therapy. Essential hypertension blood pressure well-controlled continue present management. Dyslipidemia I did review her K PN which show me data from February to this year LDL 58 HDL 84 good cholesterol profile continue present management. Rheumatoid arthritis noted followed by internal medicine team in the rheumatology   Medication Adjustments/Labs and Tests Ordered: Current medicines are reviewed at length with the patient today.  Concerns regarding medicines are outlined above.  No orders of the defined types were placed in this encounter.  Medication changes: No orders of the defined types were placed in this encounter.   Signed, Park Liter, MD, [facility] 07/03/2022 11:41 AM    [place]

## 2022-08-07 ENCOUNTER — Ambulatory Visit (INDEPENDENT_AMBULATORY_CARE_PROVIDER_SITE_OTHER): Payer: Medicare Other

## 2022-08-07 DIAGNOSIS — I639 Cerebral infarction, unspecified: Secondary | ICD-10-CM

## 2022-08-07 LAB — CUP PACEART REMOTE DEVICE CHECK
Date Time Interrogation Session: 20240101230456
Implantable Pulse Generator Implant Date: 20230816

## 2022-08-09 ENCOUNTER — Other Ambulatory Visit: Payer: Self-pay

## 2022-08-09 MED ORDER — CLOPIDOGREL BISULFATE 75 MG PO TABS: 75.0000 mg | ORAL_TABLET | Freq: Every day | ORAL | 0 refills | Status: DC

## 2022-08-13 NOTE — Progress Notes (Signed)
[facility] Summary Report / Loop Recorder 

## 2022-09-07 NOTE — Progress Notes (Signed)
[facility] Summary Report / Loop Recorder 

## 2022-09-09 LAB — CUP PACEART REMOTE DEVICE CHECK
Date Time Interrogation Session: 20240203230335
Implantable Pulse Generator Implant Date: 20230816

## 2022-09-10 ENCOUNTER — Ambulatory Visit: Payer: Medicare Other

## 2022-09-10 DIAGNOSIS — I639 Cerebral infarction, unspecified: Secondary | ICD-10-CM

## 2022-09-13 ENCOUNTER — Ambulatory Visit (INDEPENDENT_AMBULATORY_CARE_PROVIDER_SITE_OTHER): Payer: Medicare Other | Admitting: Neurology

## 2022-09-13 ENCOUNTER — Encounter: Payer: Self-pay | Admitting: Neurology

## 2022-09-13 VITALS — BP 143/84 | HR 70 | Ht <= 58 in | Wt 122.3 lb

## 2022-09-13 DIAGNOSIS — R531 Weakness: Secondary | ICD-10-CM | POA: Diagnosis not present

## 2022-09-13 DIAGNOSIS — I6350 Cerebral infarction due to unspecified occlusion or stenosis of unspecified cerebral artery: Secondary | ICD-10-CM | POA: Diagnosis not present

## 2022-09-13 MED ORDER — CLOPIDOGREL BISULFATE 75 MG PO TABS: 75.0000 mg | ORAL_TABLET | Freq: Every day | ORAL | 11 refills | Status: DC

## 2022-09-13 NOTE — Progress Notes (Signed)
[facility]  PATIENT: Haley Pham DOB: 30-Aug-1945  REQUESTING CLINICIAN: Brown-Patram, Melissa J* HISTORY FROM: Patient and husband  REASON FOR VISIT: Occipital stroke    HISTORICAL  CHIEF COMPLAINT:  Chief Complaint  Patient presents with   Follow-up    Rm 12 with husband. Here for yearly f/u reports she has been doing well since last visit.    INTERVAL HISTORY 09/13/2022:  Patient presents today for follow-up, she is accompanied by her husband.  Since last visit she has completed all her workup.  Her CT angiogram head and neck did not show any large vessel stenosis.  Her echocardiogram was normal and her 30-day cardiac monitor did not show any evidence of arrhythmia.  Currently she is on a implantable device and so far no abnormal heartbeat noted.  She completed her 90 days of Plavix and aspirin currently on Plavix alone.  Overall she is doing very well but complaints of mild generalized weakness.  She does report 2 falls in the past year; 1 was a trip and fall the other 1 was due to not sitting properly on the stool.  She presented to the hospital in both occasions and the heat CT head was negative for any acute bleeding.  She had severe bruising. She had a follow-up ophthalmology evaluation and was told everything is stable.   HISTORY OF PRESENT ILLNESS:  This is a 77 year old woman past medical history of hypertension, hyperlipidemia, rheumatoid arthritis and hypothyroidism who was referred to neurology by her ophthalmologist.  Patient reports having an eye exam and was told on the eye exam that she had a visual field defect.  Her ophthalmologist was concerned for an occipital lobe stroke therefore he ordered an MRI brain and refer the patient to neurology.  MRI brain was obtained and it showed a right occipital stroke which likely explain her left visual field cut.  Patient denies any deficit prior, cannot think of when she had a stroke because she has been her  normal self during all this time.  Currently she denies any deficit.  She reports not having seen her ophthalmologist for the past 2 years, she noted that her left eye vision was bad but related to cataract.   She is on aspirin, she is on lipid-lowering agent and blood pressure medication also.     OTHER MEDICAL CONDITIONS: HTN, RA, Hypothyroidism, HLD    REVIEW OF SYSTEMS: Full 14 system review of systems performed and negative with exception of: as noted in the HPI   ALLERGIES: No Known Allergies  HOME MEDICATIONS: Outpatient Medications Prior to Visit  Medication Sig Dispense Refill   Abatacept (ORENCIA [place]) Inject 1 each into the skin every 30 (thirty) days. Unknown strenght     acetaminophen (TYLENOL) 500 MG tablet Take 1,000 mg by mouth in the morning and at bedtime.     albuterol (PROVENTIL HFA;VENTOLIN HFA) 108 (90 Base) MCG/ACT inhaler Inhale 1 puff into the lungs every 6 (six) hours as needed for wheezing or shortness of breath.     ALPRAZolam (XANAX) 0.5 MG tablet Take 0.5 mg by mouth at bedtime. Take 1/2 tablet at bedtime     amLODipine (NORVASC) 10 MG tablet Take 5 mg by mouth 2 (two) times daily.     Calcium Carbonate-Vitamin D 600-200 MG-UNIT TABS Take 1 tablet by mouth daily.     CALCIUM-VITAMIN D PO Take 1 tablet by mouth daily. Take 2 daily     co-enzyme Q-10 30 MG capsule Take 30  mg by mouth daily.     diclofenac (VOLTAREN) 75 MG EC tablet Take 75 mg by mouth 2 (two) times daily.     docusate sodium (COLACE) 100 MG capsule Take 100 mg by mouth daily.     Ferrous Sulfate (IRON) 325 (65 Fe) MG TABS Take 1 tablet by mouth daily in the afternoon.     folic acid (FOLVITE) 1 MG tablet Take 1 mg by mouth daily.     hydroxychloroquine (PLAQUENIL) 200 MG tablet Take 200 mg by mouth 2 (two) times daily.     levothyroxine (SYNTHROID, LEVOTHROID) 137 MCG tablet Take 137 mcg by mouth daily before breakfast. Every other day alternates with the 150 mcg     levothyroxine (SYNTHROID,  LEVOTHROID) 150 MCG tablet Take 150 mcg by mouth daily before breakfast. Every other day     Lutein 20 MG CAPS Take 1 capsule by mouth daily.     methotrexate 50 MG/2ML injection Inject 25 mg into the muscle once a week.     montelukast (SINGULAIR) 10 MG tablet Take 10 mg by mouth at bedtime.     Multiple Vitamins-Minerals (CENTRUM SILVER PO) Take 1 tablet by mouth daily.     pantoprazole (PROTONIX) 20 MG tablet Take 20 mg by mouth daily.     predniSONE (DELTASONE) 5 MG tablet Take 2.5 mg by mouth daily with breakfast.     simvastatin (ZOCOR) 20 MG tablet Take 20 mg by mouth daily.     triamcinolone cream (KENALOG) 0.1 % Apply 1 Application topically as needed (rash).     valsartan (DIOVAN) 160 MG tablet Take 160 mg by mouth daily.     albuterol (PROVENTIL) (2.5 MG/3ML) 0.083% nebulizer solution Take 2.5 mg by nebulization every 6 (six) hours as needed for wheezing or shortness of breath.     alendronate (FOSAMAX) 70 MG tablet Take 70 mg by mouth once a week.     clopidogrel (PLAVIX) 75 MG tablet Take 1 tablet (75 mg total) by mouth daily. 30 tablet 0   No facility-administered medications prior to visit.    PAST MEDICAL HISTORY: Past Medical History:  Diagnosis Date   Acquired hypothyroidism 10/03/2015   Last Assessment & Plan:  Formatting of this note might be different from the original. Relevant Hx: Course: Daily Update: Today's Plan:she will return for this to be repeated  Electronically signed by: Mayer Camel, NP 01/27/16 2311   Adult-onset obesity 11/23/2021   Anxiety 10/03/2015   Last Assessment & Plan:  Formatting of this note might be different from the original. Relevant Hx: Course: Daily Update: Today's Plan:she feels this is stable for her at this time  Electronically signed by: Mayer Camel, NP 01/27/16 2319   Asthma    B12 deficiency 05/16/2020   Bilateral impacted cerumen 08/16/2021   Body mass index (BMI) 30.0-30.9, adult 11/23/2021   Cataract     right removed, left formin    Constipation    "my whole life" uses colace daily- soft stools    Degenerative joint disease 10/03/2015   Last Assessment & Plan:  Formatting of this note might be different from the original. Relevant Hx: Course: Daily Update: Today's Plan:she has the joint pain that is separate from her RA and we discussed her having something that is narcotic based if she is having pain that she cannot sleep with and she wants to avoid right now but she indicates that she will let us know if she decides to do this  Diverticulosis large intestine w/o perforation or abscess w/o bleeding 12/26/2020   Encounter for long-term (current) use of high-risk medication 10/03/2015   Essential hypertension 10/03/2015   Last Assessment & Plan:  Formatting of this note might be different from the original. Relevant Hx: Course: Daily Update: Today's Plan:this is stable for her   Electronically signed by: Mayer Camel, NP 01/27/16 2310   Heart murmur    History of stroke 10/03/2021   Formatting of this note might be different from the original. Right occipital CVA seen on MRI confirmed with CTA of the head and neck not acute, visual field deficit was the only symptom   Hyperlipidemia    Hypertension    Immunosuppressed status ([place]) 10/19/2019   Iron deficiency 05/16/2020   Macular degeneration 09/12/2017   Mild intermittent asthma without complication 9/35/7017   Last Assessment & Plan:  Formatting of this note might be different from the original. Relevant Hx: Course: Daily Update: Today's Plan:she feels she is doing fair overall from this  Electronically signed by: Mayer Camel, NP 01/27/16 2311   Mixed hyperlipidemia 10/03/2015   Last Assessment & Plan:  Formatting of this note might be different from the original. 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 Rheumatology started her on Fosamax   Osteoporosis    Other long term (current) drug therapy 11/23/2021   Ovarian mass, right 02/07/2021   Prediabetes 09/12/2017   Primary insomnia 03/12/2018   Rheumatoid arthritis ([place])    Thyroid disease    Tubulovillous adenoma of colon 2016   Vitamin D deficiency 10/03/2015   Last Assessment & Plan:  Formatting of this note might be different from the original. Relevant Hx: Course: Daily Update: Today's Plan:return for this to be drawn  Electronically signed by: Mayer Camel, NP 01/27/16 2318    PAST SURGICAL HISTORY: Past Surgical History:  Procedure Laterality Date   CATARACT EXTRACTION Right 2017   COLONOSCOPY     MOUTH SURGERY     impacted teeth   POLYPECTOMY      FAMILY HISTORY: Family History  Problem Relation Age of Onset   Pancreatic cancer Mother    Asthma Mother    Hypertension Mother    Heart disease Father    Colon cancer Neg Hx    Esophageal cancer Neg Hx    Rectal cancer Neg Hx    Stomach cancer Neg Hx    Colon polyps Neg Hx     SOCIAL HISTORY: Social History   Socioeconomic History   Marital status: Married    Spouse name: Not on file   Number of children: Not on file   Years of education: Not on file   Highest education level: Not on file  Occupational History   Not on file  Tobacco Use   Smoking status: Former   Smokeless tobacco: Never   Tobacco comments:    smoke for about 6 yrs in her 20"s  Substance and Sexual Activity   Alcohol use: No    Alcohol/week: 0.0 standard drinks of alcohol   Drug use: No   Sexual activity: Not on file  Other Topics Concern   Not on file  Social History Narrative   Not on file  Social Determinants of Health   Financial Resource Strain: Not on file  Food Insecurity: Not on file   Transportation Needs: Not on file  Physical Activity: Not on file  Stress: Not on file  Social Connections: Not on file  Intimate Partner Violence: Not on file    PHYSICAL EXAM  GENERAL EXAM/CONSTITUTIONAL: Vitals:  Vitals:   09/13/22 1136  BP: (!) 143/84  Pulse: 70  Weight: 122 lb 5 oz (55.5 kg)  Height: '4\' 9"'$  (1.448 m)    Body mass index is 26.47 kg/m. Wt Readings from Last 3 Encounters:  09/13/22 122 lb 5 oz (55.5 kg)  07/03/22 122 lb 3.2 oz (55.4 kg)  03/21/22 124 lb 12.8 oz (56.6 kg)   Patient is in no distress; well developed, nourished and groomed; neck is supple  EYES: Visual fields full to confrontation, Extraocular movements intacts,   MUSCULOSKELETAL: Gait, strength, tone, movements noted in Neurologic exam below  NEUROLOGIC: MENTAL STATUS:      No data to display         awake, alert, oriented to person, place and time recent and remote memory intact normal attention and concentration language fluent, comprehension intact, naming intact fund of knowledge appropriate  CRANIAL NERVE:  2nd, 3rd, 4th, 6th - visual fields full to confrontation, extraocular muscles intact, no nystagmus 5th - facial sensation symmetric 7th - facial strength symmetric 8th - hearing intact 9th - palate elevates symmetrically, uvula midline 11th - shoulder shrug symmetric 12th - tongue protrusion midline  MOTOR:  normal bulk and tone, full strength in the BUE, BLE  SENSORY:  normal and symmetric to light touch  COORDINATION:  finger-nose-finger, fine finger movements normal  GAIT/STATION:  Walking with a  rolling walker      DIAGNOSTIC DATA (LABS, IMAGING, TESTING) - I reviewed patient records, labs, notes, testing and imaging myself where available.  No results found for: "WBC", "HGB", "HCT", "MCV", "PLT"    Component Value Date/Time   NA 138 09/07/2021 1342   K 4.5 09/07/2021 1342   CL 98 09/07/2021 1342   CO2 26 09/07/2021 1342   GLUCOSE 96  09/07/2021 1342   BUN 13 09/07/2021 1342   CREATININE 0.72 09/07/2021 1342   CALCIUM 10.2 09/07/2021 1342   PROT 7.0 09/07/2021 1342   ALBUMIN 5.0 (H) 09/07/2021 1342   AST 22 09/07/2021 1342   ALT 22 09/07/2021 1342   ALKPHOS 54 09/07/2021 1342   BILITOT <0.2 09/07/2021 1342   Lab Results  Component Value Date   CHOL 161 09/07/2021   HDL 84 09/07/2021   LDLCALC 58 09/07/2021   TRIG 112 09/07/2021   CHOLHDL 1.9 09/07/2021   Lab Results  Component Value Date   HGBA1C 5.8 (H) 09/07/2021   No results found for: "VITAMINB12" No results found for: "TSH"   MRI Brain 09/01/2021 1.  Small remote infarct in the right medial occipital lobe. No acute infarct.  2.  Mild chronic small vessel ischemic change.  3.  Acute left maxillary sinusitis with fluid.    CTA Head and Neck 09/13/2021 1. Small remote right occipital cortex infarct. 2. Mild for age atherosclerosis. No stenosis, beading, or ulceration of major vessels.    ASSESSMENT AND PLAN  77 y.o. year old female with vascular risk factors including hypertension, hyperlipidemia who is presenting after a right occipital stroke follow up.  She completed 90 days of DAPT, currently on Plavix alone, overall doing well.  She is compliant with her other medication.  Her  only complaint at the moment is some generalized weakness, will check a TSH and vitamin B12, I will contact her to go over the result.  I did advise her to continue following up with her PCP and return as needed or for any other concerns.  She is agreeable with plan    1. 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 Weakness       Patient Instructions  Continue current medications, refills of Plavix given Will check a TSH and B12 level due to complaint of weakness, I will contact to go over the result.   Continue to follow with PCP and return as needed    Orders Placed This Encounter  Procedures   TSH   Vitamin B12    Meds ordered this encounter  Medications    clopidogrel (PLAVIX) 75 MG tablet    Sig: Take 1 tablet (75 mg total) by mouth daily.    Dispense:  30 tablet    Refill:  11    Return if symptoms worsen or fail to improve.  I have spent a total of 30 minutes dedicated to this patient today, preparing to see patient, performing a medically appropriate examination and evaluation, ordering tests and/or medications and procedures, and counseling and educating the patient/family/caregiver; independently interpreting result and communicating results to the family/patient/caregiver; and documenting clinical information in the electronic medical record.   Alric Ran, MD 09/13/2022, 12:17 PM  [facility] [address], [place], [place] [address] [phone]

## 2022-09-13 NOTE — Patient Instructions (Signed)
Continue current medications, refills of Plavix given Will check a TSH and B12 level due to complaint of weakness, I will contact to go over the result.   Continue to follow with PCP and return as needed

## 2022-09-14 LAB — VITAMIN B12: Vitamin B-12: 1881 pg/mL — ABNORMAL HIGH (ref 232–1245)

## 2022-09-14 LAB — TSH: TSH: 4.58 u[IU]/mL — ABNORMAL HIGH (ref 0.450–4.500)

## 2022-09-14 NOTE — Progress Notes (Signed)
Please call and advise the patient that the recent labs we checked were within normal limits. The TSH was slight elevated but nothing too high. You can follow up with PCP to have it recheck in a few months. Please remind patient to keep any upcoming appointments or tests and to call us with any interim questions, concerns, problems or updates. Thanks,   Alric Ran, MD

## 2022-09-17 ENCOUNTER — Telehealth: Payer: Self-pay

## 2022-09-17 NOTE — Telephone Encounter (Signed)
Called and relayed results to patient. Pt verbalized understanding. Pt had no questions at this time but was encouraged to call back if questions arise.

## 2022-09-17 NOTE — Telephone Encounter (Signed)
-----   Message from Alric Ran, MD sent at 09/14/2022  7:58 AM EST ----- Please call and advise the patient that the recent labs we checked were within normal limits. The TSH was slight elevated but nothing too high. You can follow up with PCP to have it recheck in a few months. Please remind patient to keep any upcoming appointments or tests and to call us with any interim questions, concerns, problems or updates. Thanks,   Alric Ran, MD

## 2022-10-15 ENCOUNTER — Ambulatory Visit (INDEPENDENT_AMBULATORY_CARE_PROVIDER_SITE_OTHER): Payer: Medicare Other

## 2022-10-15 DIAGNOSIS — I639 Cerebral infarction, unspecified: Secondary | ICD-10-CM | POA: Diagnosis not present

## 2022-10-17 LAB — CUP PACEART REMOTE DEVICE CHECK
Date Time Interrogation Session: 20240310230737
Implantable Pulse Generator Implant Date: 20230816

## 2022-10-25 NOTE — Progress Notes (Signed)
[facility] Summary Report / Loop Recorder 

## 2022-11-19 ENCOUNTER — Ambulatory Visit (INDEPENDENT_AMBULATORY_CARE_PROVIDER_SITE_OTHER): Payer: Medicare Other

## 2022-11-19 DIAGNOSIS — I639 Cerebral infarction, unspecified: Secondary | ICD-10-CM | POA: Diagnosis not present

## 2022-11-20 LAB — CUP PACEART REMOTE DEVICE CHECK
Date Time Interrogation Session: 20240414230317
Implantable Pulse Generator Implant Date: 20230816

## 2022-11-26 NOTE — Progress Notes (Signed)
[facility] Summary Report / Loop Recorder 

## 2022-12-24 ENCOUNTER — Ambulatory Visit (INDEPENDENT_AMBULATORY_CARE_PROVIDER_SITE_OTHER): Payer: Medicare Other

## 2022-12-24 DIAGNOSIS — I639 Cerebral infarction, unspecified: Secondary | ICD-10-CM

## 2022-12-24 LAB — CUP PACEART REMOTE DEVICE CHECK
Date Time Interrogation Session: 20240517230111
Implantable Pulse Generator Implant Date: 20230816

## 2022-12-25 NOTE — Progress Notes (Signed)
[facility] Summary Report / Loop Recorder 

## 2023-01-22 NOTE — Progress Notes (Signed)
[facility] Summary Report / Loop Recorder 

## 2023-01-24 ENCOUNTER — Ambulatory Visit (INDEPENDENT_AMBULATORY_CARE_PROVIDER_SITE_OTHER): Payer: Medicare Other

## 2023-01-24 DIAGNOSIS — I639 Cerebral infarction, unspecified: Secondary | ICD-10-CM | POA: Diagnosis not present

## 2023-01-24 LAB — CUP PACEART REMOTE DEVICE CHECK
Date Time Interrogation Session: 20240619230359
Implantable Pulse Generator Implant Date: 20230816

## 2023-02-13 NOTE — Progress Notes (Signed)
[facility] Summary Report / Loop Recorder 

## 2023-02-19 ENCOUNTER — Ambulatory Visit (INDEPENDENT_AMBULATORY_CARE_PROVIDER_SITE_OTHER): Payer: Medicare Other

## 2023-02-19 DIAGNOSIS — I639 Cerebral infarction, unspecified: Secondary | ICD-10-CM | POA: Diagnosis not present

## 2023-02-20 LAB — CUP PACEART REMOTE DEVICE CHECK
Date Time Interrogation Session: 20240715230218
Implantable Pulse Generator Implant Date: 20230816

## 2023-03-07 ENCOUNTER — Ambulatory Visit: Payer: Medicare Other | Attending: Cardiology | Admitting: Cardiology

## 2023-03-07 ENCOUNTER — Encounter: Payer: Self-pay | Admitting: Cardiology

## 2023-03-07 VITALS — BP 148/72 | HR 84 | Ht <= 58 in | Wt 118.0 lb

## 2023-03-07 DIAGNOSIS — I10 Essential (primary) hypertension: Secondary | ICD-10-CM | POA: Insufficient documentation

## 2023-03-07 DIAGNOSIS — I6930 Unspecified sequelae of cerebral infarction: Secondary | ICD-10-CM | POA: Diagnosis present

## 2023-03-07 DIAGNOSIS — Z95818 Presence of other cardiac implants and grafts: Secondary | ICD-10-CM | POA: Diagnosis present

## 2023-03-07 DIAGNOSIS — J4520 Mild intermittent asthma, uncomplicated: Secondary | ICD-10-CM | POA: Insufficient documentation

## 2023-03-07 NOTE — Patient Instructions (Signed)

## 2023-03-07 NOTE — Progress Notes (Signed)
Cardiology Office Note:    Date:  03/07/2023   ID:  Haley Pham, DOB Jul 04, 1946, MRN [id number]  PCP:  Krystal Clark, NP  Cardiologist:  Gypsy Balsam, MD    Referring MD: Rhea Bleacher*   Chief Complaint  Patient presents with   Follow-up    History of Present Illness:    Haley Pham is a 77 y.o. female past medical history significant for essential hypertension, dyslipidemia, rheumatoid arthritis, hypothyroidism, she was referred to [place] because she was found to have CVA.  Cardiac workup so far has been negative she was Zio patch for 2 weeks with no significant atrial fibrillation identified after that she received implantable loop recorder that she is does have right now so far no identification of any atrial fibrillation. Comes today to months for follow-up overall doing well.  Denies have any chest pain tightness squeezing pressure burning chest no new TIA/CVA-like symptoms.  Past Medical History:  Diagnosis Date   Acquired hypothyroidism 10/03/2015   Last Assessment & Plan:  Formatting of this note might be different from the original. Relevant Hx: Course: Daily Update: Today's Plan:she will return for this to be repeated  Electronically signed by: Krystal Clark, NP 01/27/16 2311   Adult-onset obesity 11/23/2021   Anxiety 10/03/2015   Last Assessment & Plan:  Formatting of this note might be different from the original. Relevant Hx: Course: Daily Update: Today's Plan:she feels this is stable for her at this time  Electronically signed by: Krystal Clark, NP 01/27/16 2319   Asthma    B12 deficiency 05/16/2020   Bilateral impacted cerumen 08/16/2021   Body mass index (BMI) 30.0-30.9, adult 11/23/2021   Cataract    right removed, left formin    Constipation    "my whole life" uses colace daily- soft stools    Degenerative joint disease 10/03/2015   Last Assessment & Plan:  Formatting of this note might be different from the original.  Relevant Hx: Course: Daily Update: Today's Plan:she has the joint pain that is separate from her RA and we discussed her having something that is narcotic based if she is having pain that she cannot sleep with and she wants to avoid right now but she indicates that she will let us know if she decides to do this    Diverticulosis large intestine w/o perforation or abscess w/o bleeding 12/26/2020   Encounter for long-term (current) use of high-risk medication 10/03/2015   Essential hypertension 10/03/2015   Last Assessment & Plan:  Formatting of this note might be different from the original. Relevant Hx: Course: Daily Update: Today's Plan:this is stable for her   Electronically signed by: Krystal Clark, NP 01/27/16 2310   Heart murmur    History of stroke 10/03/2021   Formatting of this note might be different from the original. Right occipital CVA seen on MRI confirmed with CTA of the head and neck not acute, visual field deficit was the only symptom   Hyperlipidemia    Hypertension    Immunosuppressed status (HCC) 10/19/2019   Iron deficiency 05/16/2020   Macular degeneration 09/12/2017   Mild intermittent asthma without complication 10/03/2015   Last Assessment & Plan:  Formatting of this note might be different from the original. Relevant Hx: Course: Daily Update: Today's Plan:she feels she is doing fair overall from this  Electronically signed by: Krystal Clark, NP 01/27/16 2311   Mixed hyperlipidemia 10/03/2015   Last Assessment & Plan:  Formatting of this note  might be different from the original. Relevant Hx: Course: Daily Update: Today's Plan:update this fasting for her  Electronically signed by: Krystal Clark, NP 01/27/16 2318   Noise-induced hearing loss of right ear 03/27/2017   Osteopenia of multiple sites 09/12/2017   Last Assessment & Plan:  Formatting of this note might be different from the original. Rheumatology started her on Fosamax   Osteoporosis     Other long term (current) drug therapy 11/23/2021   Ovarian mass, right 02/07/2021   Prediabetes 09/12/2017   Primary insomnia 03/12/2018   Rheumatoid arthritis (HCC)    Thyroid disease    Tubulovillous adenoma of colon 2016   Vitamin D deficiency 10/03/2015   Last Assessment & Plan:  Formatting of this note might be different from the original. Relevant Hx: Course: Daily Update: Today's Plan:return for this to be drawn  Electronically signed by: Krystal Clark, NP 01/27/16 2318    Past Surgical History:  Procedure Laterality Date   CATARACT EXTRACTION Right 2017   COLONOSCOPY     MOUTH SURGERY     impacted teeth   POLYPECTOMY      Current Medications: Current Meds  Medication Sig   Abatacept (ORENCIA [place]) Inject 1 each into the skin every 30 (thirty) days. Unknown strenght   acetaminophen (TYLENOL) 500 MG tablet Take 1,000 mg by mouth in the morning and at bedtime.   albuterol (PROVENTIL HFA;VENTOLIN HFA) 108 (90 Base) MCG/ACT inhaler Inhale 1 puff into the lungs every 6 (six) hours as needed for wheezing or shortness of breath.   ALPRAZolam (XANAX) 0.5 MG tablet Take 0.5 mg by mouth at bedtime. Take 1/2 tablet at bedtime   amLODipine (NORVASC) 10 MG tablet Take 5 mg by mouth 2 (two) times daily.   Calcium Carbonate-Vitamin D 600-200 MG-UNIT TABS Take 1 tablet by mouth daily.   CALCIUM-VITAMIN D PO Take 1 tablet by mouth daily. Take 2 daily   clopidogrel (PLAVIX) 75 MG tablet Take 1 tablet (75 mg total) by mouth daily.   co-enzyme Q-10 30 MG capsule Take 30 mg by mouth daily.   diclofenac (VOLTAREN) 75 MG EC tablet Take 75 mg by mouth 2 (two) times daily.   docusate sodium (COLACE) 100 MG capsule Take 100 mg by mouth daily.   Ferrous Sulfate (IRON) 325 (65 Fe) MG TABS Take 1 tablet by mouth daily in the afternoon.   folic acid (FOLVITE) 1 MG tablet Take 1 mg by mouth daily.   hydroxychloroquine (PLAQUENIL) 200 MG tablet Take 200 mg by mouth 2 (two) times daily.    levothyroxine (SYNTHROID, LEVOTHROID) 137 MCG tablet Take 137 mcg by mouth daily before breakfast. Every other day alternates with the 150 mcg   levothyroxine (SYNTHROID, LEVOTHROID) 150 MCG tablet Take 150 mcg by mouth daily before breakfast. Every other day   Lutein 20 MG CAPS Take 1 capsule by mouth daily.   methotrexate 50 MG/2ML injection Inject 25 mg into the muscle once a week.   montelukast (SINGULAIR) 10 MG tablet Take 10 mg by mouth at bedtime.   Multiple Vitamins-Minerals (CENTRUM SILVER PO) Take 1 tablet by mouth daily.   pantoprazole (PROTONIX) 20 MG tablet Take 20 mg by mouth daily.   predniSONE (DELTASONE) 5 MG tablet Take 2.5 mg by mouth daily with breakfast.   simvastatin (ZOCOR) 20 MG tablet Take 20 mg by mouth daily.   triamcinolone cream (KENALOG) 0.1 % Apply 1 Application topically as needed (rash).   valsartan (DIOVAN) 160 MG tablet Take  160 mg by mouth daily.     Allergies:   Patient has no known allergies.   Social History   Socioeconomic History   Marital status: Married    Spouse name: Not on file   Number of children: Not on file   Years of education: Not on file   Highest education level: Not on file  Occupational History   Not on file  Tobacco Use   Smoking status: Former   Smokeless tobacco: Never   Tobacco comments:    smoke for about 6 yrs in her 20"s  Substance and Sexual Activity   Alcohol use: No    Alcohol/week: 0.0 standard drinks of alcohol   Drug use: No   Sexual activity: Not on file  Other Topics Concern   Not on file  Social History Narrative   Not on file   Social Determinants of Health   Financial Resource Strain: Not on file  Food Insecurity: Not on file  Transportation Needs: Not on file  Physical Activity: Not on file  Stress: Not on file  Social Connections: Unknown (12/18/2021)   Received from [facility]   Social Network    Social Network: Not on file     Family History: The patient's family history includes  Asthma in her mother; Heart disease in her father; Hypertension in her mother; Pancreatic cancer in her mother. There is no history of Colon cancer, Esophageal cancer, Rectal cancer, Stomach cancer, or Colon polyps. ROS:   Please see the history of present illness.    All 14 point review of systems negative except as described per history of present illness  EKGs/Labs/Other Studies Reviewed:    EKG Interpretation Date/Time:  Thursday March 07 2023 11:35:00 EDT Ventricular Rate:  73 PR Interval:  214 QRS Duration:  86 QT Interval:  396 QTC Calculation: 436 R Axis:   -36  Text Interpretation: Sinus rhythm with 1st degree A-V block Left axis deviation Abnormal ECG No previous ECGs available Confirmed by Gypsy Balsam [phone]) on 03/07/2023 11:41:23 AM    Recent Labs: 09/13/2022: TSH 4.580  Recent Lipid Panel    Component Value Date/Time   CHOL 161 09/07/2021 1342   TRIG 112 09/07/2021 1342   HDL 84 09/07/2021 1342   CHOLHDL 1.9 09/07/2021 1342   LDLCALC 58 09/07/2021 1342    Physical Exam:    VS:  BP (!) 148/72 (BP Location: Right Arm, Patient Position: Sitting)   Pulse 84   Ht 4\' 9"  (1.448 m)   Wt 118 lb (53.5 kg)   SpO2 96%   BMI 25.53 kg/m     Wt Readings from Last 3 Encounters:  03/07/23 118 lb (53.5 kg)  09/13/22 122 lb 5 oz (55.5 kg)  07/03/22 122 lb 3.2 oz (55.4 kg)     GEN:  Well nourished, well developed in no acute distress HEENT: Normal NECK: No JVD; No carotid bruits LYMPHATICS: No lymphadenopathy CARDIAC: RRR, no murmurs, no rubs, no gallops RESPIRATORY:  Clear to auscultation without rales, wheezing or rhonchi  ABDOMEN: Soft, non-tender, non-distended MUSCULOSKELETAL:  No edema; No deformity  SKIN: Warm and dry LOWER EXTREMITIES: no swelling NEUROLOGIC:  Alert and oriented x 3 PSYCHIATRIC:  Normal affect   ASSESSMENT:    1. Essential hypertension   2. Mild intermittent asthma without complication   3. Late effect of cerebrovascular accident  (CVA)   4. Implantable loop recorder present    PLAN:    In order of problems listed above:  Essential  hypertension blood pressure well-controlled continue present management she said always get excited when she comes to doctor's office that is why today slightly elevated she checked it at home it is always good. Asthma followed by internal medicine team. Dyslipidemia I did review K PN which show me LDL 58 HDL 84 she is taking Zocor 20 which I will continue. Rheumatoid arthritis stable followed by rheumatology team. Implantable loop recorder I did review interrogation no evidence of atrial fibrillation   Medication Adjustments/Labs and Tests Ordered: Current medicines are reviewed at length with the patient today.  Concerns regarding medicines are outlined above.  Orders Placed This Encounter  Procedures   EKG 12-Lead   Medication changes: No orders of the defined types were placed in this encounter.   Signed, Georgeanna Lea, MD, [facility] 03/07/2023 12:10 PM    [facility]

## 2023-03-07 NOTE — Progress Notes (Signed)
[facility] Summary Report / Loop Recorder 

## 2023-03-18 ENCOUNTER — Ambulatory Visit (INDEPENDENT_AMBULATORY_CARE_PROVIDER_SITE_OTHER): Payer: Medicare Other

## 2023-03-18 DIAGNOSIS — I639 Cerebral infarction, unspecified: Secondary | ICD-10-CM

## 2023-04-01 NOTE — Progress Notes (Signed)
[facility] Summary Report / Loop Recorder 

## 2023-04-22 ENCOUNTER — Ambulatory Visit (INDEPENDENT_AMBULATORY_CARE_PROVIDER_SITE_OTHER): Payer: Medicare Other

## 2023-04-22 DIAGNOSIS — I639 Cerebral infarction, unspecified: Secondary | ICD-10-CM

## 2023-04-23 LAB — CUP PACEART REMOTE DEVICE CHECK
Date Time Interrogation Session: 20240913230246
Implantable Pulse Generator Implant Date: 20230816

## 2023-05-06 NOTE — Progress Notes (Signed)
[facility] Summary Report / Loop Recorder 

## 2023-05-27 ENCOUNTER — Ambulatory Visit: Payer: Medicare Other

## 2023-05-27 DIAGNOSIS — I639 Cerebral infarction, unspecified: Secondary | ICD-10-CM | POA: Diagnosis not present

## 2023-05-28 LAB — CUP PACEART REMOTE DEVICE CHECK
Date Time Interrogation Session: 20241020231303
Implantable Pulse Generator Implant Date: 20230816

## 2023-06-13 NOTE — Progress Notes (Signed)
[facility] Summary Report / Loop Recorder 

## 2023-07-01 ENCOUNTER — Ambulatory Visit (INDEPENDENT_AMBULATORY_CARE_PROVIDER_SITE_OTHER): Payer: Medicare Other

## 2023-07-01 DIAGNOSIS — I639 Cerebral infarction, unspecified: Secondary | ICD-10-CM | POA: Diagnosis not present

## 2023-07-01 LAB — CUP PACEART REMOTE DEVICE CHECK
Date Time Interrogation Session: 20241122230440
Implantable Pulse Generator Implant Date: 20230816

## 2023-07-29 NOTE — Progress Notes (Signed)
[facility] Summary Report / Loop Recorder 

## 2023-08-05 ENCOUNTER — Ambulatory Visit (INDEPENDENT_AMBULATORY_CARE_PROVIDER_SITE_OTHER): Payer: Medicare Other

## 2023-08-05 DIAGNOSIS — I639 Cerebral infarction, unspecified: Secondary | ICD-10-CM | POA: Diagnosis not present

## 2023-08-05 LAB — CUP PACEART REMOTE DEVICE CHECK
Date Time Interrogation Session: 20241229231534
Implantable Pulse Generator Implant Date: 20230816

## 2023-09-09 ENCOUNTER — Ambulatory Visit (INDEPENDENT_AMBULATORY_CARE_PROVIDER_SITE_OTHER): Payer: Medicare Other

## 2023-09-09 DIAGNOSIS — I639 Cerebral infarction, unspecified: Secondary | ICD-10-CM

## 2023-09-09 LAB — CUP PACEART REMOTE DEVICE CHECK
Date Time Interrogation Session: 20250202231345
Implantable Pulse Generator Implant Date: 20230816

## 2023-09-19 ENCOUNTER — Other Ambulatory Visit: Payer: Self-pay

## 2023-09-19 MED ORDER — CLOPIDOGREL BISULFATE 75 MG PO TABS: 75.0000 mg | ORAL_TABLET | Freq: Every day | ORAL | 11 refills | Status: DC

## 2023-10-14 ENCOUNTER — Ambulatory Visit (INDEPENDENT_AMBULATORY_CARE_PROVIDER_SITE_OTHER): Payer: Medicare Other

## 2023-10-14 DIAGNOSIS — I639 Cerebral infarction, unspecified: Secondary | ICD-10-CM

## 2023-10-14 LAB — CUP PACEART REMOTE DEVICE CHECK
Date Time Interrogation Session: 20250309231228
Implantable Pulse Generator Implant Date: 20230816

## 2023-10-16 NOTE — Progress Notes (Signed)
 [facility] Summary Report / Loop Recorder

## 2023-11-18 ENCOUNTER — Ambulatory Visit (INDEPENDENT_AMBULATORY_CARE_PROVIDER_SITE_OTHER): Payer: Medicare Other

## 2023-11-18 DIAGNOSIS — I639 Cerebral infarction, unspecified: Secondary | ICD-10-CM | POA: Diagnosis not present

## 2023-11-18 LAB — CUP PACEART REMOTE DEVICE CHECK
Date Time Interrogation Session: 20250413231221
Implantable Pulse Generator Implant Date: 20230816

## 2023-11-18 NOTE — Progress Notes (Signed)
 [facility] Summary Report / Loop Recorder

## 2023-12-23 ENCOUNTER — Ambulatory Visit (INDEPENDENT_AMBULATORY_CARE_PROVIDER_SITE_OTHER): Payer: Medicare Other

## 2023-12-23 ENCOUNTER — Ambulatory Visit: Payer: Self-pay | Admitting: Cardiology

## 2023-12-23 DIAGNOSIS — I639 Cerebral infarction, unspecified: Secondary | ICD-10-CM | POA: Diagnosis not present

## 2023-12-23 LAB — CUP PACEART REMOTE DEVICE CHECK
Date Time Interrogation Session: 20250518232412
Implantable Pulse Generator Implant Date: 20230816

## 2024-01-07 NOTE — Progress Notes (Signed)
 [facility] Summary Report / Loop Recorder

## 2024-01-23 ENCOUNTER — Ambulatory Visit (INDEPENDENT_AMBULATORY_CARE_PROVIDER_SITE_OTHER)

## 2024-01-23 ENCOUNTER — Ambulatory Visit: Payer: Self-pay | Admitting: Cardiology

## 2024-01-23 DIAGNOSIS — I639 Cerebral infarction, unspecified: Secondary | ICD-10-CM

## 2024-01-23 LAB — CUP PACEART REMOTE DEVICE CHECK
Date Time Interrogation Session: 20250618232315
Implantable Pulse Generator Implant Date: 20230816

## 2024-02-11 NOTE — Progress Notes (Signed)
 [facility] Summary Report / Loop Recorder

## 2024-02-24 ENCOUNTER — Ambulatory Visit

## 2024-02-24 DIAGNOSIS — I639 Cerebral infarction, unspecified: Secondary | ICD-10-CM | POA: Diagnosis not present

## 2024-02-25 LAB — CUP PACEART REMOTE DEVICE CHECK
Date Time Interrogation Session: 20250720232918
Implantable Pulse Generator Implant Date: 20230816

## 2024-03-02 ENCOUNTER — Ambulatory Visit: Payer: Self-pay | Admitting: Cardiology

## 2024-03-04 DIAGNOSIS — Z23 Encounter for immunization: Secondary | ICD-10-CM | POA: Insufficient documentation

## 2024-03-04 DIAGNOSIS — M419 Scoliosis, unspecified: Secondary | ICD-10-CM | POA: Insufficient documentation

## 2024-03-05 ENCOUNTER — Ambulatory Visit: Attending: Cardiology | Admitting: Cardiology

## 2024-03-05 ENCOUNTER — Encounter: Payer: Self-pay | Admitting: Cardiology

## 2024-03-05 ENCOUNTER — Telehealth: Payer: Self-pay | Admitting: *Deleted

## 2024-03-05 VITALS — BP 136/70 | HR 81 | Ht <= 58 in | Wt 112.4 lb

## 2024-03-05 DIAGNOSIS — E039 Hypothyroidism, unspecified: Secondary | ICD-10-CM | POA: Diagnosis not present

## 2024-03-05 DIAGNOSIS — Z8673 Personal history of transient ischemic attack (TIA), and cerebral infarction without residual deficits: Secondary | ICD-10-CM | POA: Diagnosis not present

## 2024-03-05 DIAGNOSIS — E785 Hyperlipidemia, unspecified: Secondary | ICD-10-CM | POA: Insufficient documentation

## 2024-03-05 DIAGNOSIS — I10 Essential (primary) hypertension: Secondary | ICD-10-CM | POA: Insufficient documentation

## 2024-03-05 NOTE — Progress Notes (Signed)
 Cardiology Office Note:    Date:  03/05/2024   ID:  Maddy Graham, DOB Feb 03, 1946, MRN [id number]  PCP:  Benson Eleanor Rung, NP  Cardiologist:  Lamar Fitch, MD    Referring MD: Benson Eleanor PARAS*   Chief Complaint  Patient presents with   Loop Recorder    History of Present Illness:    Indigo Barbian is a 78 y.o. female past medical history significant for essential hypertension, dyslipidemia, rheumatoid arthritis, hypothyroidism, she was referred originally to us  because of CVA cardiac workup has been negative, she did wear a Zio patch for 2 weeks no arrhythmia identified, now she does have implantable loop recorder so far no atrial fibrillation identified.  Comes today 2 months for follow-up overall doing well.  Denies any chest pain tightness squeezing pressure burning chest no dizziness passing out.  Past Medical History:  Diagnosis Date   Acquired hypothyroidism 10/03/2015   Last Assessment & Plan:  Formatting of this note might be different from the original. Relevant Hx: Course: Daily Update: Today's Plan:she will return for this to be repeated  Electronically signed by: Eleanor Rung Benson, NP 01/27/16 2311   Adult-onset obesity 11/23/2021   Anxiety 10/03/2015   Last Assessment & Plan:  Formatting of this note might be different from the original. Relevant Hx: Course: Daily Update: Today's Plan:she feels this is stable for her at this time  Electronically signed by: Eleanor Rung Benson, NP 01/27/16 2319   Asthma    B12 deficiency 05/16/2020   Bilateral impacted cerumen 08/16/2021   Body mass index (BMI) 30.0-30.9, adult 11/23/2021   Cataract    right removed, left formin    Constipation    my whole life uses colace daily- soft stools    Degenerative joint disease 10/03/2015   Last Assessment & Plan:  Formatting of this note might be different from the original. Relevant Hx: Course: Daily Update: Today's Plan:she has the joint pain that is separate  from her RA and we discussed her having something that is narcotic based if she is having pain that she cannot sleep with and she wants to avoid right now but she indicates that she will let us  know if she decides to do this    Diverticulosis large intestine w/o perforation or abscess w/o bleeding 12/26/2020   Encounter for long-term (current) use of high-risk medication 10/03/2015   Essential hypertension 10/03/2015   Last Assessment & Plan:  Formatting of this note might be different from the original. Relevant Hx: Course: Daily Update: Today's Plan:this is stable for her   Electronically signed by: Eleanor Rung Benson, NP 01/27/16 2310   Heart murmur    History of stroke 10/03/2021   Formatting of this note might be different from the original. Right occipital CVA seen on MRI confirmed with CTA of the head and neck not acute, visual field deficit was the only symptom   Hyperlipidemia    Hypertension    Immunosuppressed status (HCC) 10/19/2019   Iron deficiency 05/16/2020   Macular degeneration 09/12/2017   Mild intermittent asthma without complication 10/03/2015   Last Assessment & Plan:  Formatting of this note might be different from the original. Relevant Hx: Course: Daily Update: Today's Plan:she feels she is doing fair overall from this  Electronically signed by: Eleanor Rung Benson, NP 01/27/16 2311   Mixed hyperlipidemia 10/03/2015   Last Assessment & Plan:  Formatting of this note might be different from the original. Relevant Hx: Course: Daily Update: Today's Plan:update this fasting  for her  Electronically signed by: Eleanor Merlynn Lady, NP 01/27/16 2318   Noise-induced hearing loss of right ear 03/27/2017   Osteopenia of multiple sites 09/12/2017   Last Assessment & Plan:  Formatting of this note might be different from the original. Rheumatology started her on Fosamax   Osteoporosis    Other long term (current) drug therapy 11/23/2021   Ovarian mass, right 02/07/2021    Prediabetes 09/12/2017   Primary insomnia 03/12/2018   Rheumatoid arthritis (HCC)    Thyroid disease    Tubulovillous adenoma of colon 2016   Vitamin D deficiency 10/03/2015   Last Assessment & Plan:  Formatting of this note might be different from the original. Relevant Hx: Course: Daily Update: Today's Plan:return for this to be drawn  Electronically signed by: Eleanor Merlynn Lady, NP 01/27/16 2318    Past Surgical History:  Procedure Laterality Date   CATARACT EXTRACTION Right 2017   COLONOSCOPY     MOUTH SURGERY     impacted teeth   POLYPECTOMY      Current Medications: Current Meds  Medication Sig   Abatacept (ORENCIA [place]) Inject 1 each into the skin every 30 (thirty) days. Unknown strenght   acetaminophen (TYLENOL) 500 MG tablet Take 1,000 mg by mouth in the morning and at bedtime.   albuterol (PROVENTIL HFA;VENTOLIN HFA) 108 (90 Base) MCG/ACT inhaler Inhale 1 puff into the lungs every 6 (six) hours as needed for wheezing or shortness of breath.   ALPRAZolam (XANAX) 0.5 MG tablet Take 0.5 mg by mouth at bedtime. Take 1/2 tablet at bedtime   amLODipine (NORVASC) 10 MG tablet Take 5 mg by mouth 2 (two) times daily.   Calcium Carbonate-Vitamin D 600-200 MG-UNIT TABS Take 1 tablet by mouth daily.   CALCIUM-VITAMIN D PO Take 1 tablet by mouth daily. Take 2 daily   clopidogrel  (PLAVIX ) 75 MG tablet Take 1 tablet (75 mg total) by mouth daily.   co-enzyme Q-10 30 MG capsule Take 30 mg by mouth daily.   diclofenac (VOLTAREN) 75 MG EC tablet Take 75 mg by mouth 2 (two) times daily.   docusate sodium (COLACE) 100 MG capsule Take 100 mg by mouth daily.   Ferrous Sulfate (IRON) 325 (65 Fe) MG TABS Take 1 tablet by mouth daily in the afternoon.   folic acid (FOLVITE) 1 MG tablet Take 1 mg by mouth daily.   hydroxychloroquine (PLAQUENIL) 200 MG tablet Take 200 mg by mouth 2 (two) times daily.   levothyroxine (SYNTHROID, LEVOTHROID) 137 MCG tablet Take 137 mcg by mouth daily before  breakfast. Every other day alternates with the 150 mcg   levothyroxine (SYNTHROID, LEVOTHROID) 150 MCG tablet Take 150 mcg by mouth daily before breakfast. Every other day   Lutein 20 MG CAPS Take 1 capsule by mouth daily.   methotrexate 50 MG/2ML injection Inject 25 mg into the muscle once a week.   montelukast (SINGULAIR) 10 MG tablet Take 10 mg by mouth at bedtime.   Multiple Vitamins-Minerals (CENTRUM SILVER PO) Take 1 tablet by mouth daily.   pantoprazole (PROTONIX) 20 MG tablet Take 20 mg by mouth daily.   predniSONE (DELTASONE) 5 MG tablet Take 2.5 mg by mouth daily with breakfast.   simvastatin (ZOCOR) 20 MG tablet Take 20 mg by mouth daily.   triamcinolone cream (KENALOG) 0.1 % Apply 1 Application topically as needed (rash).   valsartan (DIOVAN) 160 MG tablet Take 160 mg by mouth daily.     Allergies:   Patient has no  known allergies.   Social History   Socioeconomic History   Marital status: Married    Spouse name: Not on file   Number of children: Not on file   Years of education: Not on file   Highest education level: Not on file  Occupational History   Not on file  Tobacco Use   Smoking status: Former   Smokeless tobacco: Never   Tobacco comments:    smoke for about 6 yrs in her 76s  Substance and Sexual Activity   Alcohol use: No    Alcohol/week: 0.0 standard drinks of alcohol   Drug use: No   Sexual activity: Not on file  Other Topics Concern   Not on file  Social History Narrative   Not on file   Social Drivers of Health   Financial Resource Strain: Not on file  Food Insecurity: Low Risk  (01/28/2024)   Received from [facility]   Hunger Vital Sign    Within the past 12 months, you worried that your food would run out before you got money to buy more: Never true    Within the past 12 months, the food you bought just didn't last and you didn't have money to get more. : Never true  Transportation Needs: No Transportation Needs (01/28/2024)   Received  from [facility]    In the past 12 months, has lack of reliable transportation kept you from medical appointments, meetings, work or from getting things needed for daily living? : No  Physical Activity: Not on file  Stress: Not on file  Social Connections: Unknown (12/18/2021)   Received [facility]   Social Network    Social Network: Not on file     Family History: The patient's family history includes Asthma in her mother; Heart disease in her father; Hypertension in her mother; Pancreatic cancer in her mother. There is no history of Colon cancer, Esophageal cancer, Rectal cancer, Stomach cancer, or Colon polyps. ROS:   Please see the history of present illness.    All 14 point review of systems negative except as described per history of present illness  EKGs/Labs/Other Studies Reviewed:    EKG Interpretation Date/Time:  Thursday March 05 2024 10:33:33 EDT Ventricular Rate:  81 PR Interval:  222 QRS Duration:  88 QT Interval:  358 QTC Calculation: 415 R Axis:   -47  Text Interpretation: Sinus rhythm with 1st degree A-V block Possible Left atrial enlargement Left anterior fascicular block Abnormal ECG When compared with ECG of 07-Mar-2023 11:35, No significant change was found Confirmed by Bernie Charleston [phone]) on 03/05/2024 10:47:06 AM    Recent Labs: No results found for requested labs within last 365 days.  Recent Lipid Panel    Component Value Date/Time   CHOL 161 09/07/2021 1342   TRIG 112 09/07/2021 1342   HDL 84 09/07/2021 1342   CHOLHDL 1.9 09/07/2021 1342   LDLCALC 58 09/07/2021 1342    Physical Exam:    VS:  BP 136/70 (BP Location: Left Arm, Patient Position: Sitting)   Pulse 81   Ht 4' 8 (1.422 m)   Wt 112 lb 6.4 oz (51 kg)   SpO2 99%   BMI 25.20 kg/m     Wt Readings from Last 3 Encounters:  03/05/24 112 lb 6.4 oz (51 kg)  03/07/23 118 lb (53.5 kg)  09/13/22 122 lb 5 oz (55.5 kg)     GEN:  Well nourished, well  developed in no acute  distress HEENT: Normal NECK: No JVD; No carotid bruits LYMPHATICS: No lymphadenopathy CARDIAC: RRR, no murmurs, no rubs, no gallops RESPIRATORY:  Clear to auscultation without rales, wheezing or rhonchi  ABDOMEN: Soft, non-tender, non-distended MUSCULOSKELETAL:  No edema; No deformity  SKIN: Warm and dry LOWER EXTREMITIES: no swelling NEUROLOGIC:  Alert and oriented x 3 PSYCHIATRIC:  Normal affect   ASSESSMENT:    1. Essential hypertension   2. Acquired hypothyroidism   3. Dyslipidemia   4. History of stroke    PLAN:    In order of problems listed above:  Implantable recorder in place.  She had some question about device to bedside device to have some orange to light which make her worried.  Will contact EP team trying to answer the question if this is an issue or not.  I did review last interrogation from 02/23/2024 good communication no atrial fibrillation identified. Hypothyroidism to be followed by internal medicine team, TSH-from February is 4.5 continue present management. Dyslipidemia I did review K PN which show me LDL 58 HDL 84 we will continue present management. History of stroke no new events.   Medication Adjustments/Labs and Tests Ordered: Current medicines are reviewed at length with the patient today.  Concerns regarding medicines are outlined above.  Orders Placed This Encounter  Procedures   EKG 12-Lead   Medication changes: No orders of the defined types were placed in this encounter.   Signed, Lamar DOROTHA Fitch, MD, [facility] 03/05/2024 11:03 AM    [facility]

## 2024-03-05 NOTE — Patient Instructions (Signed)

## 2024-03-05 NOTE — Telephone Encounter (Signed)
-----   Message from Nurse Olam DEL sent at 03/05/2024 11:10 AM EDT ----- Per Dr. Bernie- Pt reported implantable loop recorder she does have some different lights on the device that is at the bedside table.

## 2024-03-05 NOTE — Telephone Encounter (Signed)
 Outreach made to patient to address possible issues with bedside monitor. No answer. Left message to call back.

## 2024-03-06 NOTE — Telephone Encounter (Signed)
 Spoke with pt and we have fixed her monitor and it is now working transmission received

## 2024-03-24 NOTE — Progress Notes (Signed)
 [facility] Summary Report / Loop Recorder

## 2024-03-25 ENCOUNTER — Encounter

## 2024-03-26 ENCOUNTER — Encounter

## 2024-03-26 ENCOUNTER — Ambulatory Visit

## 2024-03-26 LAB — CUP PACEART REMOTE DEVICE CHECK
Date Time Interrogation Session: 20250820233741
Implantable Pulse Generator Implant Date: 20230816

## 2024-03-27 ENCOUNTER — Ambulatory Visit: Payer: Self-pay | Admitting: Cardiology

## 2024-04-26 ENCOUNTER — Encounter

## 2024-04-27 ENCOUNTER — Ambulatory Visit (INDEPENDENT_AMBULATORY_CARE_PROVIDER_SITE_OTHER)

## 2024-04-27 DIAGNOSIS — I639 Cerebral infarction, unspecified: Secondary | ICD-10-CM

## 2024-04-27 LAB — CUP PACEART REMOTE DEVICE CHECK
Date Time Interrogation Session: 20250921232318
Implantable Pulse Generator Implant Date: 20230816

## 2024-04-28 ENCOUNTER — Ambulatory Visit: Payer: Self-pay | Admitting: Cardiology

## 2024-04-28 NOTE — Progress Notes (Signed)
 Remote Loop Recorder Transmission

## 2024-05-05 ENCOUNTER — Telehealth: Payer: Self-pay | Admitting: *Deleted

## 2024-05-05 DIAGNOSIS — R011 Cardiac murmur, unspecified: Secondary | ICD-10-CM

## 2024-05-05 NOTE — Telephone Encounter (Signed)
 Received request for Echo to be done for Heart Murmur by Melissa Joyce Brown Patram, NP

## 2024-05-11 NOTE — Progress Notes (Signed)
 Remote Loop Recorder Transmission

## 2024-05-27 ENCOUNTER — Encounter

## 2024-05-28 ENCOUNTER — Ambulatory Visit

## 2024-05-28 ENCOUNTER — Encounter

## 2024-05-28 DIAGNOSIS — I639 Cerebral infarction, unspecified: Secondary | ICD-10-CM | POA: Diagnosis not present

## 2024-05-28 LAB — CUP PACEART REMOTE DEVICE CHECK
Date Time Interrogation Session: 20251022231909
Implantable Pulse Generator Implant Date: 20230816

## 2024-05-29 NOTE — Progress Notes (Signed)
 Remote Loop Recorder Transmission

## 2024-06-02 ENCOUNTER — Ambulatory Visit: Payer: Self-pay | Admitting: Cardiology

## 2024-06-27 ENCOUNTER — Encounter

## 2024-06-28 ENCOUNTER — Encounter

## 2024-06-28 ENCOUNTER — Ambulatory Visit

## 2024-06-29 ENCOUNTER — Ambulatory Visit

## 2024-06-30 ENCOUNTER — Ambulatory Visit

## 2024-06-30 DIAGNOSIS — I639 Cerebral infarction, unspecified: Secondary | ICD-10-CM

## 2024-06-30 LAB — CUP PACEART REMOTE DEVICE CHECK
Date Time Interrogation Session: 20251124231543
Implantable Pulse Generator Implant Date: 20230816

## 2024-07-01 ENCOUNTER — Ambulatory Visit: Payer: Self-pay | Admitting: Cardiology

## 2024-07-01 NOTE — Progress Notes (Signed)
 Remote Loop Recorder Transmission

## 2024-07-28 ENCOUNTER — Encounter

## 2024-07-31 ENCOUNTER — Ambulatory Visit

## 2024-07-31 ENCOUNTER — Ambulatory Visit: Payer: Self-pay | Admitting: Cardiology

## 2024-07-31 DIAGNOSIS — I639 Cerebral infarction, unspecified: Secondary | ICD-10-CM

## 2024-07-31 LAB — CUP PACEART REMOTE DEVICE CHECK
Date Time Interrogation Session: 20251225231657
Implantable Pulse Generator Implant Date: 20230816

## 2024-08-05 NOTE — Progress Notes (Signed)
 Remote Loop Recorder Transmission

## 2024-08-26 ENCOUNTER — Other Ambulatory Visit: Payer: Self-pay | Admitting: Neurology

## 2024-08-28 ENCOUNTER — Encounter

## 2024-08-29 ENCOUNTER — Encounter

## 2024-08-31 ENCOUNTER — Ambulatory Visit

## 2024-08-31 DIAGNOSIS — I639 Cerebral infarction, unspecified: Secondary | ICD-10-CM

## 2024-08-31 LAB — CUP PACEART REMOTE DEVICE CHECK
Date Time Interrogation Session: 20260125232227
Implantable Pulse Generator Implant Date: 20230816

## 2024-09-01 ENCOUNTER — Ambulatory Visit: Payer: Self-pay | Admitting: Cardiology

## 2024-09-07 NOTE — Progress Notes (Signed)
 Remote Loop Recorder Transmission

## 2024-09-08 ENCOUNTER — Other Ambulatory Visit: Payer: Self-pay | Admitting: Neurology

## 2024-09-28 ENCOUNTER — Encounter

## 2024-09-29 ENCOUNTER — Encounter

## 2024-10-01 ENCOUNTER — Ambulatory Visit

## 2024-10-29 ENCOUNTER — Encounter

## 2024-10-30 ENCOUNTER — Encounter

## 2024-11-01 ENCOUNTER — Ambulatory Visit

## 2024-11-29 ENCOUNTER — Encounter

## 2024-11-30 ENCOUNTER — Encounter

## 2024-12-02 ENCOUNTER — Ambulatory Visit

## 2024-12-31 ENCOUNTER — Encounter

## 2025-01-02 ENCOUNTER — Ambulatory Visit

## 2025-02-02 ENCOUNTER — Ambulatory Visit

## 2025-03-05 ENCOUNTER — Ambulatory Visit

## 2025-04-05 ENCOUNTER — Ambulatory Visit

## 2025-05-06 ENCOUNTER — Ambulatory Visit

## 2025-06-06 ENCOUNTER — Ambulatory Visit

## 2025-07-07 ENCOUNTER — Ambulatory Visit

## 2025-08-07 ENCOUNTER — Ambulatory Visit
